# Patient Record
Sex: Female | Born: 1985 | Race: White | Hispanic: No | Marital: Married | Smoking: Never smoker
Health system: Southern US, Community
[De-identification: ages and names within clinical notes are randomized; demographics above are authoritative.]

## PROBLEM LIST (undated history)

## (undated) DIAGNOSIS — R87629 Unspecified abnormal cytological findings in specimens from vagina: Secondary | ICD-10-CM

---

## 2010-11-01 HISTORY — PX: COLPOSCOPY: SHX161

## 2012-04-26 ENCOUNTER — Encounter: Payer: Self-pay | Admitting: Gastroenterology

## 2012-04-26 ENCOUNTER — Ambulatory Visit (INDEPENDENT_AMBULATORY_CARE_PROVIDER_SITE_OTHER): Payer: BC Managed Care – PPO | Admitting: Gastroenterology

## 2012-04-26 VITALS — BP 132/74 | HR 68 | Ht 68.0 in | Wt 170.0 lb

## 2012-04-26 DIAGNOSIS — K625 Hemorrhage of anus and rectum: Secondary | ICD-10-CM

## 2012-04-26 MED ORDER — MOVIPREP 100 G PO SOLR: 1.0000 | ORAL | Status: DC

## 2012-04-26 NOTE — Progress Notes (Signed)
HPI: This is a   very pleasant 26 year old woman who I am meeting for the first time today.   This past weekend blood in stool.  All day.  Was bloody diarrhea at first.  She gets occasional loose stools but never bleeding, usuall dairy related. Worse pains in abd.  [facility] Was given cipro/flagyl.  She had labs (wbc 13K), also given pred tapering pack.  No bowel troubles in family.  Overall stable weight.    No sick contacts.   Review of systems: Pertinent positive and negative review of systems were noted in the above HPI section. Complete review of systems was performed and was otherwise normal.    History reviewed. No pertinent past medical history.  History reviewed. No pertinent past surgical history.  Current Outpatient Prescriptions  Medication Sig Dispense Refill  . ciprofloxacin (CIPRO) 500 MG tablet Take 500 mg by mouth 2 (two) times daily.      . clindamycin (CLINDAGEL) 1 % gel Apply 1 application topically 2 (two) times daily.      . metroNIDAZOLE (FLAGYL) 500 MG tablet Take 500 mg by mouth 3 (three) times daily.      . Norgestimate-Ethinyl Estradiol Triphasic (TRI-SPRINTEC) 0.18/0.215/0.25 MG-35 MCG tablet Take 1 tablet by mouth daily.      . predniSONE (DELTASONE) 10 MG tablet Take 10 mg by mouth as directed.        Allergies as of 04/26/2012  . (No Known Allergies)    History reviewed. No pertinent family history.  History   Social History  . Marital Status: Single    Spouse Name: N/A    Number of Children: N/A  . Years of Education: N/A   Occupational History  . Not on file.   Social History Main Topics  . Smoking status: Never Smoker   . Smokeless tobacco: Not on file  . Alcohol Use: Yes     rare  . Drug Use: No  . Sexually Active: Not on file   Other Topics Concern  . Not on file   Social History Narrative  . No narrative on file       Physical Exam: BP 132/74  Pulse 68  Ht 5\' 8"  (1.727 m)  Wt 170 lb (77.111 kg)   BMI 25.85 kg/m2 Constitutional: generally well-appearing Psychiatric: alert and oriented x3 Eyes: extraocular movements intact Mouth: oral pharynx moist, no lesions Neck: supple no lymphadenopathy Cardiovascular: heart regular rate and rhythm Lungs: clear to auscultation bilaterally Abdomen: soft, nontender, nondistended, no obvious ascites, no peritoneal signs, normal bowel sounds Extremities: no lower extremity edema bilaterally Skin: no lesions on visible extremities    Assessment and plan: 26 y.o. female with  acute, bloody diarrhea that is already improving  Most likely this was an infectious process. She was started on Cipro, Flagyl, also prednisone. She is going to complete her antibiotic and steroid taper. Given the bleeding I think we should proceed with endoscopic examination with colonoscopy in 4-5 weeks to allow some more time for healing. She knows to call back sooner if she has trouble. She has had significant nausea with the Flagyl and I recommended she cut back to 250 mg 3 times a day rather than 500.

## 2012-04-26 NOTE — Patient Instructions (Addendum)
You will be set up for a colonoscopy in 4-5 weeks. Continue the antibiotics until gone.  #1 side effect of flagyl is nausea. Continue the steroids until gone. Ok to decrease dose of flagyl to 250mg  three times  A day due to nausea ((cut the 500mg  pill in half).

## 2012-06-13 ENCOUNTER — Other Ambulatory Visit: Payer: BC Managed Care – PPO | Admitting: Gastroenterology

## 2016-11-01 NOTE — L&D Delivery Note (Signed)
Patient was C/C/+1and pushed for approx 1hr 40 minutes with epidural.   NSVD female infant, Apgars 8/9, weight pending.   The patient had a superficial midline vaginal laceration not extending to perineum, repaired with 2-0 vicryl. Fundus was firm. EBL was expected amount. Placenta was delivered intact. Vagina was clear.  Baby was vigorous and doing skin to skin with mother.  Vicki Gill

## 2017-02-09 LAB — OB RESULTS CONSOLE GC/CHLAMYDIA
Chlamydia: NEGATIVE
GC PROBE AMP, GENITAL: NEGATIVE

## 2017-02-09 LAB — OB RESULTS CONSOLE HIV ANTIBODY (ROUTINE TESTING): HIV: NONREACTIVE

## 2017-02-09 LAB — OB RESULTS CONSOLE ANTIBODY SCREEN: Antibody Screen: NEGATIVE

## 2017-02-09 LAB — OB RESULTS CONSOLE RUBELLA ANTIBODY, IGM: RUBELLA: IMMUNE

## 2017-02-09 LAB — OB RESULTS CONSOLE ABO/RH: RH Type: NEGATIVE

## 2017-02-09 LAB — OB RESULTS CONSOLE RPR: RPR: NONREACTIVE

## 2017-02-09 LAB — OB RESULTS CONSOLE HEPATITIS B SURFACE ANTIGEN: Hepatitis B Surface Ag: NEGATIVE

## 2017-06-23 ENCOUNTER — Other Ambulatory Visit (HOSPITAL_COMMUNITY): Payer: Self-pay | Admitting: Obstetrics

## 2017-06-23 DIAGNOSIS — Z3689 Encounter for other specified antenatal screening: Secondary | ICD-10-CM

## 2017-07-05 ENCOUNTER — Encounter (HOSPITAL_COMMUNITY): Payer: Self-pay | Admitting: *Deleted

## 2017-07-05 ENCOUNTER — Encounter: Payer: Self-pay | Admitting: Gastroenterology

## 2017-07-05 ENCOUNTER — Encounter (HOSPITAL_COMMUNITY): Payer: Self-pay

## 2017-07-06 ENCOUNTER — Ambulatory Visit (HOSPITAL_COMMUNITY)
Admission: RE | Admit: 2017-07-06 | Discharge: 2017-07-06 | Disposition: A | Discharge status: Home / Self Care | Payer: BLUE CROSS/BLUE SHIELD | Source: Ambulatory Visit | Attending: Obstetrics | Admitting: Obstetrics

## 2017-07-06 ENCOUNTER — Encounter (HOSPITAL_COMMUNITY): Payer: Self-pay

## 2017-07-06 ENCOUNTER — Other Ambulatory Visit (HOSPITAL_COMMUNITY): Payer: Self-pay | Admitting: *Deleted

## 2017-07-06 DIAGNOSIS — Z3689 Encounter for other specified antenatal screening: Secondary | ICD-10-CM

## 2017-07-06 DIAGNOSIS — Z3A33 33 weeks gestation of pregnancy: Secondary | ICD-10-CM | POA: Insufficient documentation

## 2017-07-06 DIAGNOSIS — O283 Abnormal ultrasonic finding on antenatal screening of mother: Secondary | ICD-10-CM | POA: Diagnosis present

## 2017-07-06 DIAGNOSIS — O359XX0 Maternal care for (suspected) fetal abnormality and damage, unspecified, not applicable or unspecified: Secondary | ICD-10-CM

## 2017-07-06 HISTORY — DX: Unspecified abnormal cytological findings in specimens from vagina: R87.629

## 2017-07-26 LAB — OB RESULTS CONSOLE GBS: GBS: NEGATIVE

## 2017-07-27 ENCOUNTER — Ambulatory Visit (HOSPITAL_COMMUNITY)
Admission: RE | Admit: 2017-07-27 | Discharge: 2017-07-27 | Disposition: A | Discharge status: Home / Self Care | Payer: BLUE CROSS/BLUE SHIELD | Source: Ambulatory Visit | Attending: Obstetrics | Admitting: Obstetrics

## 2017-07-27 ENCOUNTER — Other Ambulatory Visit (HOSPITAL_COMMUNITY): Payer: Self-pay | Admitting: Obstetrics and Gynecology

## 2017-07-27 ENCOUNTER — Encounter (HOSPITAL_COMMUNITY): Payer: Self-pay

## 2017-07-27 DIAGNOSIS — Z3A36 36 weeks gestation of pregnancy: Secondary | ICD-10-CM | POA: Diagnosis not present

## 2017-07-27 DIAGNOSIS — Z362 Encounter for other antenatal screening follow-up: Secondary | ICD-10-CM | POA: Insufficient documentation

## 2017-07-27 DIAGNOSIS — O359XX0 Maternal care for (suspected) fetal abnormality and damage, unspecified, not applicable or unspecified: Secondary | ICD-10-CM

## 2017-07-27 DIAGNOSIS — O283 Abnormal ultrasonic finding on antenatal screening of mother: Secondary | ICD-10-CM | POA: Insufficient documentation

## 2017-08-12 ENCOUNTER — Inpatient Hospital Stay (HOSPITAL_COMMUNITY): Payer: BLUE CROSS/BLUE SHIELD | Admitting: Anesthesiology

## 2017-08-12 ENCOUNTER — Inpatient Hospital Stay (HOSPITAL_COMMUNITY)
Admission: AD | Admit: 2017-08-12 | Discharge: 2017-08-14 | DRG: 807 | Disposition: A | Discharge status: Home / Self Care | Payer: BLUE CROSS/BLUE SHIELD | Source: Ambulatory Visit | Attending: Obstetrics and Gynecology | Admitting: Obstetrics and Gynecology

## 2017-08-12 ENCOUNTER — Encounter (HOSPITAL_COMMUNITY): Payer: Self-pay | Admitting: *Deleted

## 2017-08-12 DIAGNOSIS — Z6791 Unspecified blood type, Rh negative: Secondary | ICD-10-CM

## 2017-08-12 DIAGNOSIS — Z3490 Encounter for supervision of normal pregnancy, unspecified, unspecified trimester: Secondary | ICD-10-CM

## 2017-08-12 DIAGNOSIS — Z3A38 38 weeks gestation of pregnancy: Secondary | ICD-10-CM

## 2017-08-12 DIAGNOSIS — O4292 Full-term premature rupture of membranes, unspecified as to length of time between rupture and onset of labor: Principal | ICD-10-CM | POA: Diagnosis present

## 2017-08-12 DIAGNOSIS — O26893 Other specified pregnancy related conditions, third trimester: Secondary | ICD-10-CM | POA: Diagnosis present

## 2017-08-12 LAB — COMPREHENSIVE METABOLIC PANEL
ALBUMIN: 3.5 g/dL (ref 3.5–5.0)
ALT: 20 U/L (ref 14–54)
ANION GAP: 7 (ref 5–15)
AST: 21 U/L (ref 15–41)
Alkaline Phosphatase: 181 U/L — ABNORMAL HIGH (ref 38–126)
BILIRUBIN TOTAL: 1.2 mg/dL (ref 0.3–1.2)
BUN: 10 mg/dL (ref 6–20)
CO2: 21 mmol/L — ABNORMAL LOW (ref 22–32)
Calcium: 9.5 mg/dL (ref 8.9–10.3)
Chloride: 107 mmol/L (ref 101–111)
Creatinine, Ser: 0.69 mg/dL (ref 0.44–1.00)
GFR calc non Af Amer: >60 mL/min (ref >60)
GLUCOSE: 94 mg/dL (ref 65–99)
POTASSIUM: 3.8 mmol/L (ref 3.5–5.1)
Sodium: 135 mmol/L (ref 135–145)
TOTAL PROTEIN: 6.5 g/dL (ref 6.5–8.1)

## 2017-08-12 LAB — CBC
HCT: 36.1 % (ref 36.0–46.0)
HEMOGLOBIN: 12.6 g/dL (ref 12.0–15.0)
MCH: 30.6 pg (ref 26.0–34.0)
MCHC: 34.9 g/dL (ref 30.0–36.0)
MCV: 87.6 fL (ref 78.0–100.0)
Platelets: 180 10*3/uL (ref 150–400)
RBC: 4.12 MIL/uL (ref 3.87–5.11)
RDW: 12.5 % (ref 11.5–15.5)
WBC: 14.3 10*3/uL — ABNORMAL HIGH (ref 4.0–10.5)

## 2017-08-12 LAB — TYPE AND SCREEN
ABO/RH(D): O NEG
Antibody Screen: NEGATIVE

## 2017-08-12 LAB — ABO/RH: ABO/RH(D): O NEG

## 2017-08-12 MED ORDER — BENZOCAINE-MENTHOL 20-0.5 % EX AERO: 1.0000 "application " | INHALATION_SPRAY | CUTANEOUS | Status: DC | PRN

## 2017-08-12 MED ORDER — SIMETHICONE 80 MG PO CHEW: 80.0000 mg | CHEWABLE_TABLET | ORAL | Status: DC | PRN

## 2017-08-12 MED ORDER — PHENYLEPHRINE 40 MCG/ML (10ML) SYRINGE FOR IV PUSH (FOR BLOOD PRESSURE SUPPORT)
80.0000 ug | PREFILLED_SYRINGE | INTRAVENOUS | Status: DC | PRN
  Filled 2017-08-12: qty 5

## 2017-08-12 MED ORDER — LIDOCAINE HCL (PF) 1 % IJ SOLN
INTRAMUSCULAR | Status: DC | PRN
  Administered 2017-08-12 (×2): 4 mL

## 2017-08-12 MED ORDER — PRENATAL MULTIVITAMIN CH
1.0000 | ORAL_TABLET | Freq: Every day | ORAL | Status: DC
  Administered 2017-08-13 – 2017-08-14 (×2): 1 via ORAL
  Filled 2017-08-12 (×2): qty 1

## 2017-08-12 MED ORDER — LACTATED RINGERS IV SOLN: INTRAVENOUS | Status: DC

## 2017-08-12 MED ORDER — EPHEDRINE 5 MG/ML INJ
10.0000 mg | INTRAVENOUS | Status: DC | PRN
  Filled 2017-08-12: qty 2

## 2017-08-12 MED ORDER — ACETAMINOPHEN 325 MG PO TABS
650.0000 mg | ORAL_TABLET | ORAL | Status: DC | PRN
Start: 2017-08-12 — End: 2017-08-14

## 2017-08-12 MED ORDER — TETANUS-DIPHTH-ACELL PERTUSSIS 5-2.5-18.5 LF-MCG/0.5 IM SUSP: 0.5000 mL | Freq: Once | INTRAMUSCULAR | Status: DC

## 2017-08-12 MED ORDER — BUTORPHANOL TARTRATE 1 MG/ML IJ SOLN: 1.0000 mg | INTRAMUSCULAR | Status: DC | PRN

## 2017-08-12 MED ORDER — ONDANSETRON HCL 4 MG/2ML IJ SOLN: 4.0000 mg | Freq: Four times a day (QID) | INTRAMUSCULAR | Status: DC | PRN

## 2017-08-12 MED ORDER — OXYTOCIN 40 UNITS IN LACTATED RINGERS INFUSION - SIMPLE MED
2.5000 [IU]/h | INTRAVENOUS | Status: DC
  Filled 2017-08-12: qty 1000

## 2017-08-12 MED ORDER — DIPHENHYDRAMINE HCL 25 MG PO CAPS: 25.0000 mg | ORAL_CAPSULE | Freq: Four times a day (QID) | ORAL | Status: DC | PRN

## 2017-08-12 MED ORDER — SENNOSIDES-DOCUSATE SODIUM 8.6-50 MG PO TABS
2.0000 | ORAL_TABLET | ORAL | Status: DC
  Administered 2017-08-13: 2 via ORAL

## 2017-08-12 MED ORDER — OXYTOCIN BOLUS FROM INFUSION: 500.0000 mL | Freq: Once | INTRAVENOUS | Status: DC

## 2017-08-12 MED ORDER — OXYCODONE-ACETAMINOPHEN 5-325 MG PO TABS: 2.0000 | ORAL_TABLET | ORAL | Status: DC | PRN

## 2017-08-12 MED ORDER — LIDOCAINE HCL (PF) 1 % IJ SOLN
30.0000 mL | INTRAMUSCULAR | Status: DC | PRN
  Filled 2017-08-12: qty 30

## 2017-08-12 MED ORDER — ONDANSETRON HCL 4 MG PO TABS
4.0000 mg | ORAL_TABLET | ORAL | Status: DC | PRN
Start: 2017-08-12 — End: 2017-08-14

## 2017-08-12 MED ORDER — PHENYLEPHRINE 40 MCG/ML (10ML) SYRINGE FOR IV PUSH (FOR BLOOD PRESSURE SUPPORT)
80.0000 ug | PREFILLED_SYRINGE | INTRAVENOUS | Status: DC | PRN
  Filled 2017-08-12: qty 10
  Filled 2017-08-12: qty 5

## 2017-08-12 MED ORDER — FLEET ENEMA 7-19 GM/118ML RE ENEM: 1.0000 | ENEMA | Freq: Every day | RECTAL | Status: DC | PRN

## 2017-08-12 MED ORDER — COCONUT OIL OIL
1.0000 "application " | TOPICAL_OIL | Status: DC | PRN
  Filled 2017-08-12: qty 120

## 2017-08-12 MED ORDER — OXYCODONE-ACETAMINOPHEN 5-325 MG PO TABS: 1.0000 | ORAL_TABLET | ORAL | Status: DC | PRN

## 2017-08-12 MED ORDER — WITCH HAZEL-GLYCERIN EX PADS: 1.0000 "application " | MEDICATED_PAD | CUTANEOUS | Status: DC | PRN

## 2017-08-12 MED ORDER — IBUPROFEN 600 MG PO TABS
600.0000 mg | ORAL_TABLET | Freq: Four times a day (QID) | ORAL | Status: DC
  Administered 2017-08-13 – 2017-08-14 (×6): 600 mg via ORAL
  Filled 2017-08-12 (×6): qty 1

## 2017-08-12 MED ORDER — DIPHENHYDRAMINE HCL 50 MG/ML IJ SOLN: 12.5000 mg | INTRAMUSCULAR | Status: DC | PRN

## 2017-08-12 MED ORDER — ONDANSETRON HCL 4 MG/2ML IJ SOLN: 4.0000 mg | INTRAMUSCULAR | Status: DC | PRN

## 2017-08-12 MED ORDER — ZOLPIDEM TARTRATE 5 MG PO TABS
5.0000 mg | ORAL_TABLET | Freq: Every evening | ORAL | Status: DC | PRN
Start: 2017-08-12 — End: 2017-08-14

## 2017-08-12 MED ORDER — DIBUCAINE 1 % RE OINT: 1.0000 "application " | TOPICAL_OINTMENT | RECTAL | Status: DC | PRN

## 2017-08-12 MED ORDER — LACTATED RINGERS IV SOLN: 500.0000 mL | INTRAVENOUS | Status: DC | PRN

## 2017-08-12 MED ORDER — FENTANYL 2.5 MCG/ML BUPIVACAINE 1/10 % EPIDURAL INFUSION (WH - ANES)
14.0000 mL/h | INTRAMUSCULAR | Status: DC | PRN
  Administered 2017-08-12: 14 mL/h via EPIDURAL
  Filled 2017-08-12: qty 100

## 2017-08-12 MED ORDER — LACTATED RINGERS IV SOLN: 500.0000 mL | Freq: Once | INTRAVENOUS | Status: DC

## 2017-08-12 MED ORDER — ACETAMINOPHEN 325 MG PO TABS: 650.0000 mg | ORAL_TABLET | ORAL | Status: DC | PRN

## 2017-08-12 MED ORDER — TERBUTALINE SULFATE 1 MG/ML IJ SOLN
0.2500 mg | Freq: Once | INTRAMUSCULAR | Status: DC | PRN
  Filled 2017-08-12: qty 1

## 2017-08-12 MED ORDER — OXYTOCIN 40 UNITS IN LACTATED RINGERS INFUSION - SIMPLE MED
1.0000 m[IU]/min | INTRAVENOUS | Status: DC
  Administered 2017-08-12: 2 m[IU]/min via INTRAVENOUS

## 2017-08-12 MED ORDER — FENTANYL 2.5 MCG/ML BUPIVACAINE 1/10 % EPIDURAL INFUSION (WH - ANES): 14.0000 mL/h | INTRAMUSCULAR | Status: DC | PRN

## 2017-08-12 MED ORDER — SOD CITRATE-CITRIC ACID 500-334 MG/5ML PO SOLN: 30.0000 mL | ORAL | Status: DC | PRN

## 2017-08-12 NOTE — Anesthesia Procedure Notes (Signed)
Epidural Patient location during procedure: OB  Staffing Anesthesiologist: Brileigh Sevcik Performed: anesthesiologist   Preanesthetic Checklist Completed: patient identified, pre-op evaluation, timeout performed, IV checked, risks and benefits discussed and monitors and equipment checked  Epidural Patient position: sitting Prep: site prepped and draped and DuraPrep Patient monitoring: heart rate, continuous pulse ox and blood pressure Approach: midline Location: L3-L4 Injection technique: LOR air and LOR saline  Needle:  Needle type: Tuohy  Needle gauge: 17 G Needle length: 9 cm Needle insertion depth: 5 cm Catheter type: closed end flexible Catheter size: 19 Gauge Catheter at skin depth: 10 cm Test dose: negative  Assessment Sensory level: T8 Events: blood not aspirated, injection not painful, no injection resistance, negative IV test and no paresthesia  Additional Notes Reason for block:procedure for pain     

## 2017-08-12 NOTE — H&P (Signed)
31 y.o. [redacted]w[redacted]d  G1P0000 comes in from office for direct admission due to PROM.  Pt reports water broke approx 3am.  Otherwise has good fetal movement and no bleeding.  Past Medical History:  Diagnosis Date  . Vaginal Pap smear, abnormal     Past Surgical History:  Procedure Laterality Date  . COLPOSCOPY  2012    OB History  Gravida Para Term Preterm AB Living  1 0 0 0 0    SAB TAB Ectopic Multiple Live Births  0 0 0        # Outcome Date GA Lbr Len/2nd Weight Sex Delivery Anes PTL Lv  1 Current               Social History   Social History  . Marital status: Married    Spouse name: N/A  . Number of children: N/A  . Years of education: N/A   Occupational History  . Not on file.   Social History Main Topics  . Smoking status: Never Smoker  . Smokeless tobacco: Never Used  . Alcohol use Yes     Comment: none with pregnancy  . Drug use: No  . Sexual activity: Not on file   Other Topics Concern  . Not on file   Social History Narrative   ** Merged History Encounter **       Patient has no known allergies.    Prenatal Transfer Tool  Maternal Diabetes: No Genetic Screening: Normal Maternal Ultrasounds/Referrals: Abnormal:  Findings:   Fetal renal pyelectasis, Other: right, with duplicated collecting system.  Left collecting system is normal,  peds uro after delivery Fetal Ultrasounds or other Referrals:  Referred to Materal Fetal Medicine for above Maternal Substance Abuse:  No Significant Maternal Medications:  None Significant Maternal Lab Results: Lab values include: Group B Strep negative, Rh negative  Other PNC: uncomplicated.    Vitals:   08/12/17 1307 08/12/17 1350  BP: (!) 159/102 (!) 158/97  Pulse: 67 68  Resp:    Temp:       Lungs/Cor:  NAD Abdomen:  soft, gravid Ex:  no cords, erythema SVE:  4/80/-1 FHTs:  145, good STV, NST R Toco:  q2-5   A/P   Admit with PROM 3am  GBS Neg  Pitocin 2x2 started   Last [place] EFW 6#7 on  07/27/2017  Epidural when desired  Other routine care  [place], [facility]

## 2017-08-12 NOTE — Anesthesia Preprocedure Evaluation (Signed)
Anesthesia Evaluation  Patient identified by MRN, date of birth, ID band Patient awake    Reviewed: Allergy & Precautions, NPO status , Patient's Chart, lab work & pertinent test results  Airway Mallampati: II  TM Distance: >3 FB Neck ROM: Full    Dental no notable dental hx.    Pulmonary neg pulmonary ROS,    Pulmonary exam normal breath sounds clear to auscultation       Cardiovascular negative cardio ROS Normal cardiovascular exam Rhythm:Regular Rate:Normal     Neuro/Psych negative neurological ROS  negative psych ROS   GI/Hepatic negative GI ROS, Neg liver ROS,   Endo/Other  negative endocrine ROS  Renal/GU negative Renal ROS  negative genitourinary   Musculoskeletal negative musculoskeletal ROS (+)   Abdominal   Peds negative pediatric ROS (+)  Hematology negative hematology ROS (+)   Anesthesia Other Findings   Reproductive/Obstetrics negative OB ROS                             Anesthesia Physical Anesthesia Plan  ASA: II  Anesthesia Plan: Epidural   Post-op Pain Management:    Induction:   PONV Risk Score and Plan:   Airway Management Planned:   Additional Equipment:   Intra-op Plan:   Post-operative Plan:   Informed Consent: I have reviewed the patients History and Physical, chart, labs and discussed the procedure including the risks, benefits and alternatives for the proposed anesthesia with the patient or authorized representative who has indicated his/her understanding and acceptance.     Plan Discussed with:   Anesthesia Plan Comments:         Anesthesia Quick Evaluation

## 2017-08-12 NOTE — Anesthesia Pain Management Evaluation Note (Signed)
  CRNA Pain Management Visit Note  Patient: Vicki Gill, 31 y.o., female  "Hello I am a member of the anesthesia team at [facility]. We have an anesthesia team available at all times to provide care throughout the hospital, including epidural management and anesthesia for C-section. I don't know your plan for the delivery whether it a natural birth, water birth, IV sedation, nitrous supplementation, doula or epidural, but we want to meet your pain goals."   1.Was your pain managed to your expectations on prior hospitalizations?   No   2.What is your expectation for pain management during this hospitalization?     Epidural VS Natural  3.How can we help you reach that goal? Would like to try natural patient is open to epidural  Record the patient's initial score and the patient's pain goal.   Pain: 1  Pain Goal: 8 The [facility] wants you to be able to say your pain was always managed very well.  Rica Records 08/12/2017

## 2017-08-13 ENCOUNTER — Encounter (HOSPITAL_COMMUNITY): Payer: Self-pay | Admitting: Family

## 2017-08-13 LAB — CBC
HEMATOCRIT: 32.4 % — AB (ref 36.0–46.0)
Hemoglobin: 11.5 g/dL — ABNORMAL LOW (ref 12.0–15.0)
MCH: 31 pg (ref 26.0–34.0)
MCHC: 35.5 g/dL (ref 30.0–36.0)
MCV: 87.3 fL (ref 78.0–100.0)
PLATELETS: 173 10*3/uL (ref 150–400)
RBC: 3.71 MIL/uL — ABNORMAL LOW (ref 3.87–5.11)
RDW: 12.6 % (ref 11.5–15.5)
WBC: 17.1 10*3/uL — AB (ref 4.0–10.5)

## 2017-08-13 LAB — RPR: RPR Ser Ql: NONREACTIVE

## 2017-08-13 NOTE — Anesthesia Postprocedure Evaluation (Signed)
Anesthesia Post Note  Patient: Vicki Gill  Procedure(s) Performed: AN AD HOC LABOR EPIDURAL     Patient location during evaluation: Mother Baby Anesthesia Type: Epidural Level of consciousness: awake and alert and oriented Pain management: satisfactory to patient Vital Signs Assessment: post-procedure vital signs reviewed and stable Respiratory status: spontaneous breathing and nonlabored ventilation Cardiovascular status: stable Postop Assessment: no headache, no backache, no signs of nausea or vomiting, adequate PO intake and patient able to bend at knees (patient up walking) Anesthetic complications: no    Last Vitals:  Vitals:   08/12/17 2323 08/13/17 0322  BP:  137/82  Pulse:  97  Resp:  19  Temp: 36.6 C   SpO2:      Last Pain:  Vitals:   08/13/17 0536  TempSrc:   PainSc: 3    Pain Goal:                 Madison Hickman

## 2017-08-13 NOTE — Lactation Note (Addendum)
This note was copied from a baby's chart. Lactation Consultation Note  Patient Name: Vicki Gill Today's Date: 08/13/2017 Reason for consult: Initial assessment;Difficult latch (LC assisted to re-latch the baby on the left breast without the NS in cross cradle )  Baby is 17 hours old  As LC entered the room baby latched with #16 NS on the left breast with depth, released shortly after (  5-7 mins , no milk in the NS noted) . With moms permission checked the compressibility of the that areola , noted to be compressible enough to try to latch without the NS . Baby latched with depth and fed 15 -20 mins more with swallows, increased with breast compressions.  When baby released on his  own the nipple was well rounded.  Family walked in. And LC unable to size for the#20 NS.  Mother informed of post-discharge support and given phone number to the lactation department, including services for phone call assistance; out-patient appointments; and breastfeeding support group. List of other breastfeeding resources in the community given in the handout. Encouraged mother to call for problems or concerns related to breastfeeding.  Report given to the [facility] and reviewed the [facility] plan below.   LC Plan and recommendations :  Breast shells between feedings except when sleeping( LC instructed mom )  Prior to latch - breast massage , hand express, pre-pump with hand pump  If areola compressible attempt to latch - check for FISH Lips -   if unable to apply NS #20 .  Listen for swallows , and intermittent breast compressions.  After the baby finishes - check for milk in the NS.  If the NS had to be used for latching - have the [facility] set up the DEBP for post pumping.  Mom aware the hand pump for now is for pre- pumping, DEBP for post pumping.     Maternal Data Has patient been taught Hand Expression?: Yes Does the patient have breastfeeding experience prior to this delivery?:  No  Feeding Feeding Type: Breast Fed Length of feed: 20 min (swallows noted )  LATCH Score Latch: Grasps breast easily, tongue down, lips flanged, rhythmical sucking.  Audible Swallowing: A few with stimulation (increased swallows noted , areola compressible )  Type of Nipple: Everted at rest and after stimulation  Comfort (Breast/Nipple): Soft / non-tender  Hold (Positioning): Assistance needed to correctly position infant at breast and maintain latch.  LATCH Score: 8  Interventions Interventions: Breast feeding basics reviewed;Assisted with latch;Skin to skin;Hand express;Breast compression;Adjust position;Support pillows;Position options;Shells;Hand pump (see LC note )  Lactation Tools Discussed/Used Tools: Shells;Pump Nipple shield size: 16;20;Other (comment) (baby latched without the NS , ) Shell Type: Inverted Breast pump type: Manual Pump Review: Setup, frequency, and cleaning Initiated by:: MAI  Date initiated:: 08/13/17   Consult Status Consult Status: Follow-up Date: 08/14/17 Follow-up type: In-patient    Vicki Gill Vicki Gill 08/13/2017, 2:24 PM

## 2017-08-13 NOTE — Progress Notes (Addendum)
Patient is eating, ambulating, voiding.  Pain control is good. Appropriate lochia..  No HA, vision change or RUQ pain.  No CP/SOB.  No other complaints.  Vitals:   08/12/17 2323 08/13/17 0322 08/13/17 0915 08/13/17 1015  BP:  137/82 (!) 139/93 (!) 138/91  Pulse: 79 97 85   Resp: Temp: 97.9 F (36.6 C)  97.9 F (36.6 C)   TempSrc: Oral  Oral   SpO2:   100%   Weight:      Height:        Fundus firm Perineum without swelling. Ext: no calf tenderness  Lab Results  Component Value Date   WBC 17.1 (H) 08/13/2017   HGB 11.5 (L) 08/13/2017   HCT 32.4 (L) 08/13/2017   MCV 87.3 08/13/2017   PLT 173 08/13/2017    --/--/O NEG, O NEG (10/12 1155)  A/P Post partum day 1. Circ desired, reviewed risks/benefits/potential complications, consent obtained.  Will do later today or tomorrow am prior to discharge. Mild range BPs, will continue to monitor WBC 17, afebrile, will recheck CBC in am.  Routine care.  Expect d/c 10/14.    Philip Aspen

## 2017-08-14 LAB — CBC WITH DIFFERENTIAL/PLATELET
BASOS ABS: 0 10*3/uL (ref 0.0–0.1)
Basophils Relative: 0 %
EOS PCT: 1 %
Eosinophils Absolute: 0.2 10*3/uL (ref 0.0–0.7)
HEMATOCRIT: 33.2 % — AB (ref 36.0–46.0)
Hemoglobin: 11.4 g/dL — ABNORMAL LOW (ref 12.0–15.0)
LYMPHS PCT: 19 %
Lymphs Abs: 2.3 10*3/uL (ref 0.7–4.0)
MCH: 31 pg (ref 26.0–34.0)
MCHC: 34.3 g/dL (ref 30.0–36.0)
MCV: 90.2 fL (ref 78.0–100.0)
Monocytes Absolute: 0.4 10*3/uL (ref 0.1–1.0)
Monocytes Relative: 4 %
NEUTROS ABS: 9.3 10*3/uL — AB (ref 1.7–7.7)
Neutrophils Relative %: 76 %
PLATELETS: 164 10*3/uL (ref 150–400)
RBC: 3.68 MIL/uL — AB (ref 3.87–5.11)
RDW: 12.8 % (ref 11.5–15.5)
WBC: 12.3 10*3/uL — AB (ref 4.0–10.5)

## 2017-08-14 NOTE — Discharge Summary (Signed)
Obstetric Discharge Summary Reason for Admission: rupture of membranes Prenatal Procedures: ultrasound Intrapartum Procedures: spontaneous vaginal delivery Postpartum Procedures: none Complications-Operative and Postpartum: vaginal laceration Hemoglobin  Date Value Ref Range Status  08/14/2017 11.4 (L) 12.0 - 15.0 g/dL Final   HCT  Date Value Ref Range Status  08/14/2017 33.2 (L) 36.0 - 46.0 % Final    Physical Exam:  General: alert and cooperative Lochia: appropriate Uterine Fundus: firm DVT Evaluation: No evidence of DVT seen on physical exam.  Discharge Diagnoses: Term Pregnancy-delivered  Discharge Information: Date: 08/14/2017 Activity: pelvic rest Diet: routine Medications: PNV and Ibuprofen Condition: stable Instructions: refer to practice specific booklet Discharge to: home Follow-up Information    Philip Aspen, DO Follow up in 1 week(s).   Specialty:  Obstetrics and Gynecology Why:  for blood pressure check Contact information: [address] [place] [address] [phone]           Newborn Data: Live born female  Birth Weight: 7 lb 6.7 oz (3365 g) APGAR: 8, 9  Newborn Delivery   Birth date/time:  08/12/2017 20:36:00 Delivery type:  Vaginal, Spontaneous Delivery      Home with mother.  Philip Aspen 08/14/2017, 8:46 AM

## 2017-08-14 NOTE — Lactation Note (Signed)
This note was copied from a baby's chart. Lactation Consultation Note  Patient Name: Boy Yanice Brightbill Today's Date: 08/14/2017 Reason for consult: Follow-up assessment;Other (Comment) (resized mom for the NS in case she needs it )  Baby is 18 hours old , post circ,  As LC entered the room baby had just fed on the right breast / cradle position with #16 NS / no milk in the NS  Baby still awake and hungry. LC assisted mom to latch on the left breast / cross cradle without the NS , multiple swallows  Noted and baby fed for 10 mins, - unlatched and then acting hungry , LC assisted to re-latch on the left breast / football and Jackelin fed for 8 mins with swallows and depth. Nipple well rounded when Arieal released.  LC encouraged mom to feed STS until the baby can stay awake for a feeding ,  Shells between feedings except when sleeping.  Prior to latch - breast massage , hand express, pre-pump to make the nipple more erect ,  And attempt to latch , if unable to use the #20 NS .  After the baby feeds 1st breast , offer the 2nd breast if the baby only feeds one side , post pump both breast for 10-15 mins , save milk for next feeding.  Mom denies soreness , sore nipple and engorgement prevention and tx.  LC recommended to be consistent with I/O's , shells , pre- pumping ., and post pumping .  Mother informed of post-discharge support and given phone number to the lactation department, including services for phone call assistance; out-patient appointments; and breastfeeding support group. List of other breastfeeding resources in the community given in the handout. Encouraged mother to call for problems or concerns related to breastfeeding.   Maternal Data Has patient been taught Hand Expression?: Yes  Feeding Feeding Type: Breast Fed (left breast / football ) Length of feed: 8 min (multilple swallows , increased with breast compressions )  LATCH Score Latch: Grasps breast easily, tongue down, lips  flanged, rhythmical sucking.  Audible Swallowing: Spontaneous and intermittent  Type of Nipple: Everted at rest and after stimulation  Comfort (Breast/Nipple): Filling, red/small blisters or bruises, mild/mod discomfort  Hold (Positioning): Assistance needed to correctly position infant at breast and maintain latch.  LATCH Score: 8  Interventions Interventions: Breast feeding basics reviewed  Lactation Tools Discussed/Used Tools: Nipple Dorris Carnes;Shells;Pump Nipple shield size: 20;Other (comment) (better fit ) Shell Type: Inverted Breast pump type: Manual WIC Program: No   Consult Status Consult Status: Follow-up Date:  (mom receptive to return for [facility] consult - request in Epic ) Follow-up type: Out-patient    Matilde Sprang Kennethia Lynes 08/14/2017, 2:22 PM

## 2017-08-16 ENCOUNTER — Telehealth: Payer: Self-pay | Admitting: General Practice

## 2017-08-16 NOTE — Telephone Encounter (Signed)
Patient called and left message stating she gave birth on Friday and is breastfeeding. Patient states her right breast is really hard and she has high blood pressure & a bad headache. Called patient and she states she actually talked to her doctor yesterday and is feeling better. Patient asked about appt for Thursday & I confirmed time with her. Patient had no questions

## 2017-09-14 IMAGING — US US MFM OB DETAIL+14 WK
1 series · 13 of 28 positions shown · non-contrast
Comparison: none

[Series 1: us mfm ob detail+14 wk · 110 acquisitions, 13 frames shown]
[im 5/110]
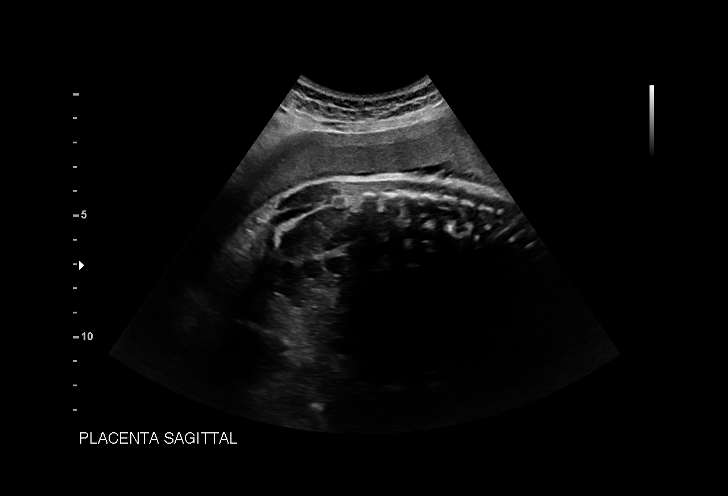
[im 13/110]
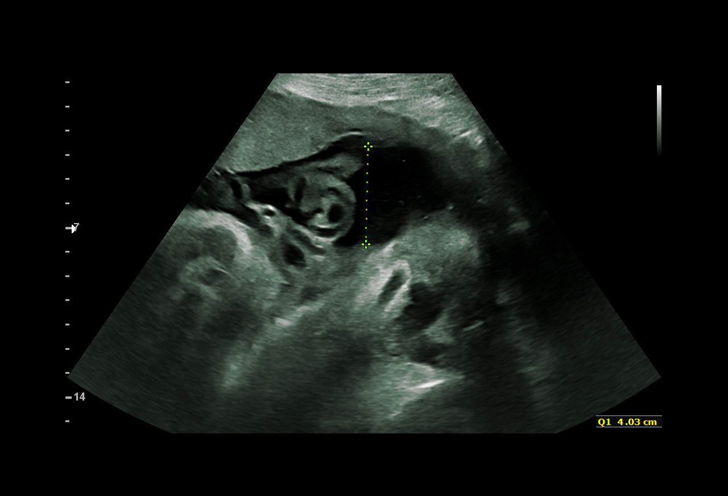
[im 21/110]
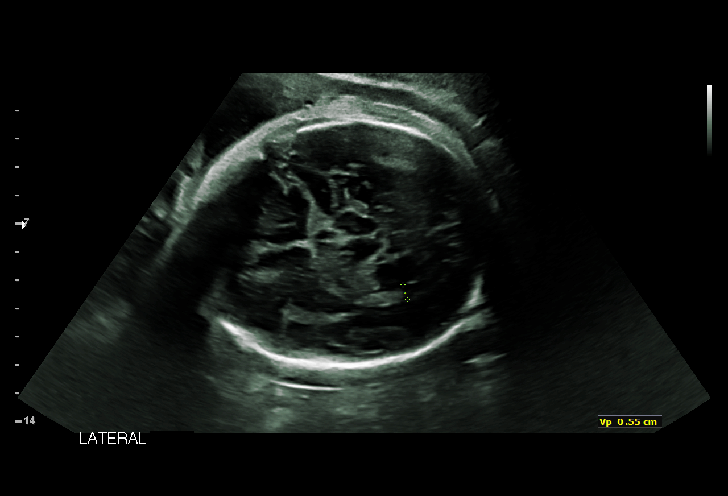
[im 29/110]
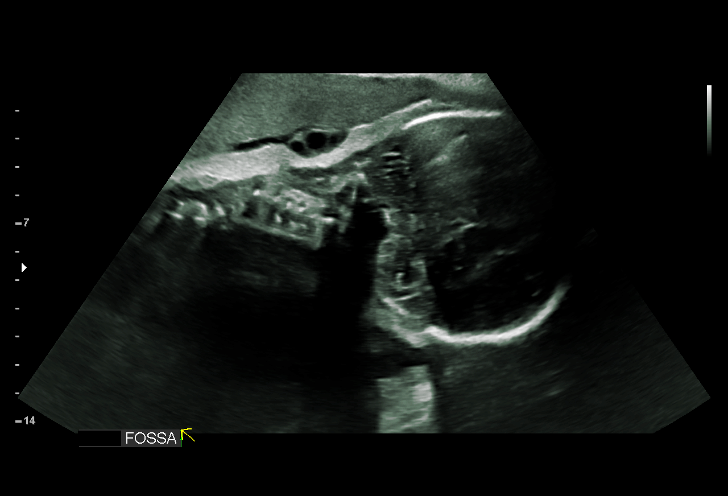
[im 37/110]
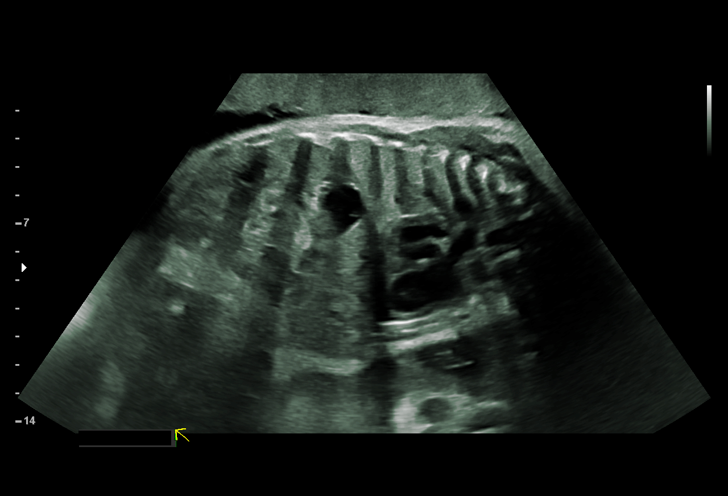
[im 45/110]
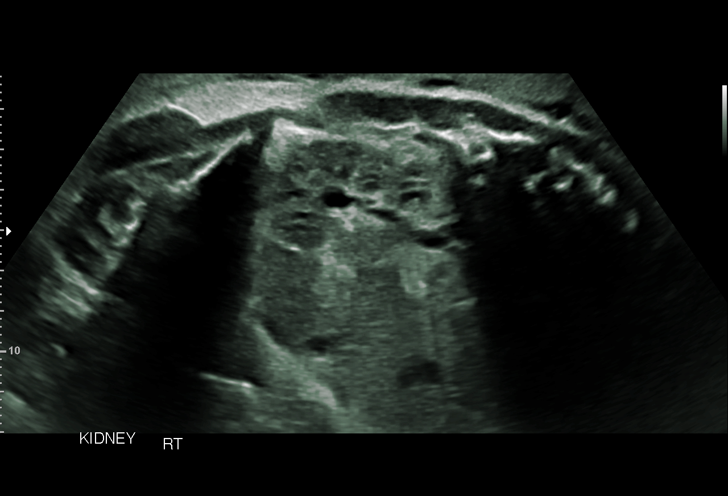
[im 57/110]
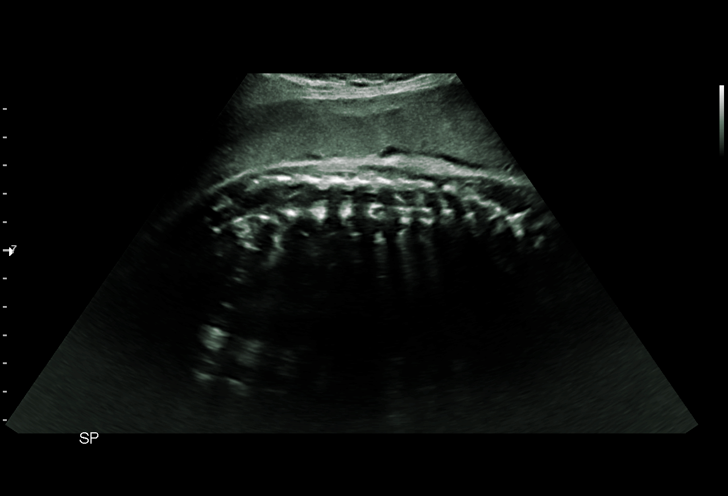
[im 65/110]
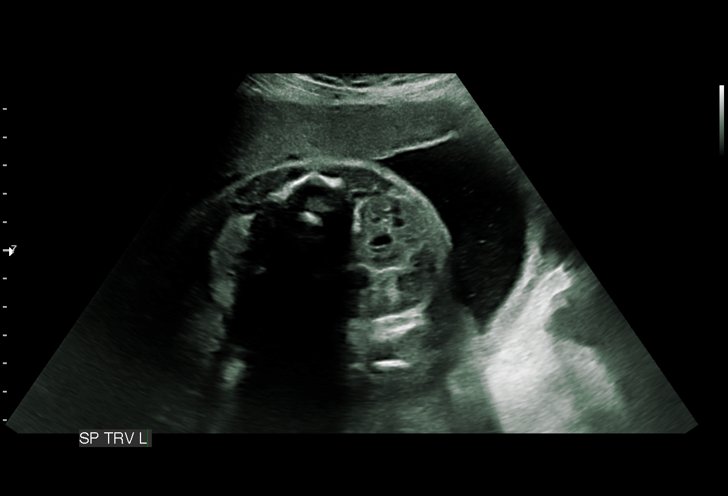
[im 73/110]
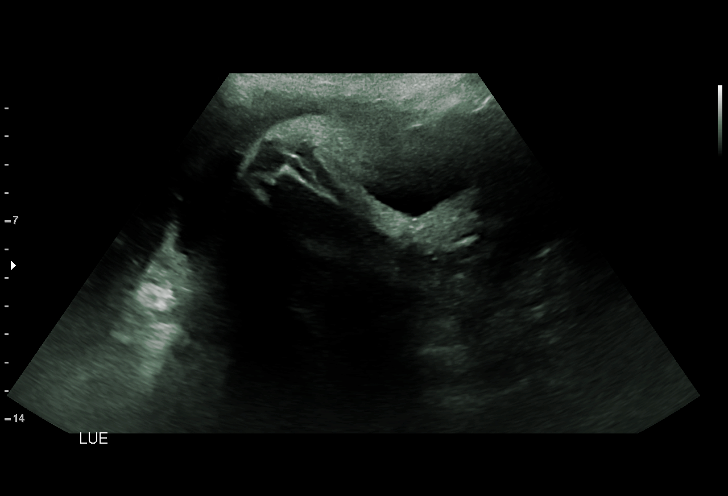
[im 81/110]
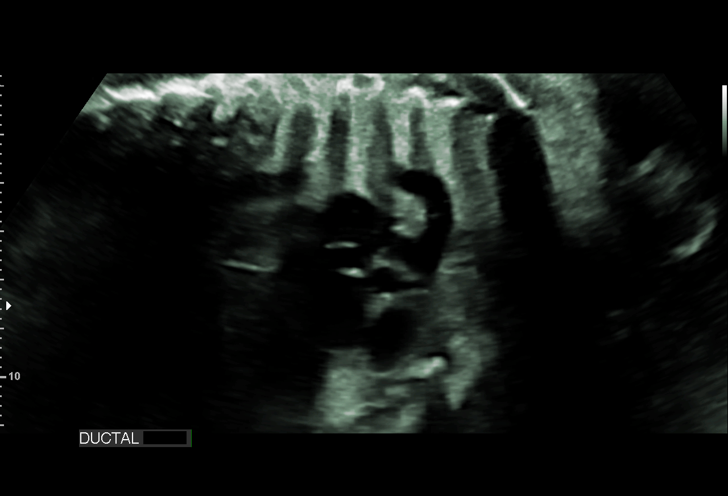
[im 89/110]
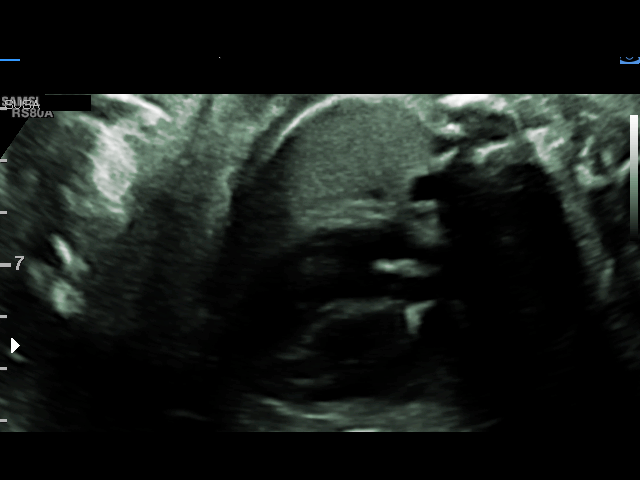
[im 97/110]
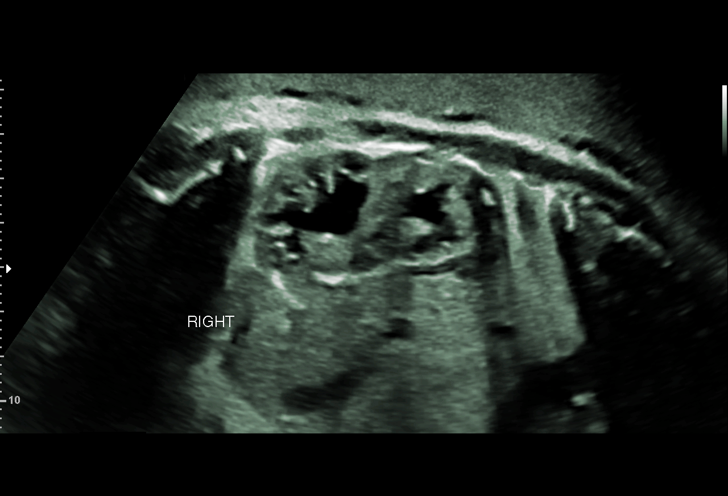
[im 105/110]
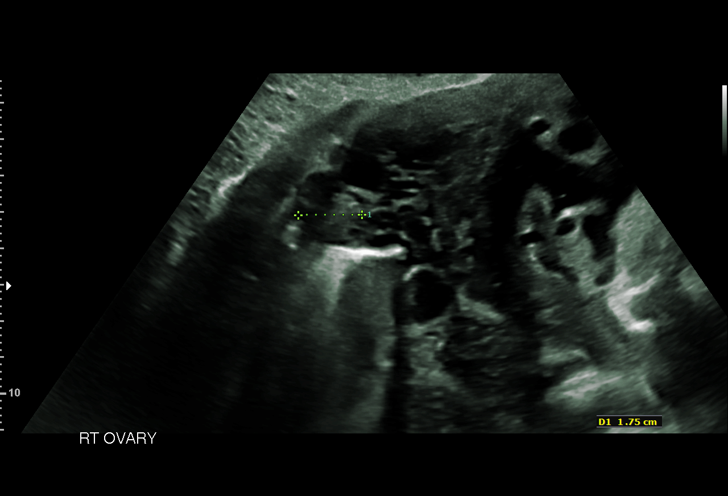

[13 of 28 positions shown; findings below may reference images not displayed]

OBGYN

1  MACHE SANTOYA             [id number]       [id number]     [id number]
Indications

33 weeks gestation of pregnancy
Pyelectasis of fetus on prenatal ultrasound
Encounter for antenatal screening for
malformations
OB History

Gravidity:    1
Fetal Evaluation

Num Of Fetuses:     1
Fetal Heart         135
Rate(bpm):
Cardiac Activity:   Observed
Presentation:       Cephalic
Placenta:           Anterior, above cervical os
P. Cord Insertion:  Visualized, central

Amniotic Fluid
AFI FV:      Subjectively within normal limits

AFI Sum(cm)     %Tile       Largest Pocket(cm)
19.88           74

RUQ(cm)       RLQ(cm)       LUQ(cm)        LLQ(cm)
4.03
Biometry

BPD:      87.2  mm     G. Age:  35w 1d         87  %    CI:        77.39   %   70 - 86
FL/HC:      21.3   %   19.4 -
HC:      313.8  mm     G. Age:  35w 1d         55  %    HC/AC:      0.99       0.96 -
AC:      316.4  mm     G. Age:  35w 4d         94  %    FL/BPD:     76.7   %   71 - 87
FL:       66.9  mm     G. Age:  34w 3d         62  %    FL/AC:      21.1   %   20 - 24
HUM:      57.7  mm     G. Age:  33w 3d         56  %
CER:      44.6  mm     G. Age:  38w 4d       > 95  %
LV:        5.5  mm

Est. FW:    2093  gm    5 lb 12 oz      83  %
Gestational Age

Clinical EDD:  33w 4d                                        EDD:   08/20/17
U/S Today:     35w 1d                                        EDD:   08/09/17
Best:          33w 4d    Det. By:   Clinical EDD             EDD:   08/20/17
Anatomy

Cranium:               Appears normal         Aortic Arch:            Appears normal
Cavum:                 Appears normal         Ductal Arch:            Appears normal
Ventricles:            Appears normal         Diaphragm:              Appears normal
Choroid Plexus:        Appears normal         Stomach:                Appears normal, left
sided
Cerebellum:            Appears normal         Abdomen:                Appears normal
Posterior Fossa:       Appears normal         Abdominal Wall:         Appears nml (cord
insert, abd wall)
Nuchal Fold:           Not applicable (>20    Cord Vessels:           Appears normal (3
wks GA)                                        vessel cord)
Face:                  Not well visualized    Kidneys:                Double collecting
system, pyelect
Lips:                  Not well visualized    Bladder:                Appears normal
Thoracic:              Appears normal         Spine:                  Appears normal
Heart:                 Appears normal         Upper Extremities:      Visualized
(4CH, axis, and
situs)
RVOT:                  Appears normal         Lower Extremities:      Visualized
LVOT:                  Appears normal

Other:  Technically difficult due to advanced gestational age. Technically
difficult due to fetal position. Parents do not wish to know sex of fetus.
Ltd views of extremities.
Cervix Uterus Adnexa

Cervix
Not visualized (advanced GA >00wks)

Left Ovary
Within normal limits.

Right Ovary
Within normal limits.
Impression

Singleton intrauterine pregnancy at 33+4 with pyelectasis
Review of the anatomy shows right dual collecting system
with attendant pyelectasis in both collecting systems.
contralateral kidney is normal.  Otherwise, no sonographic
markers for aneuploidy or structural anomalies are seen
However, facial evaluation should be considered suboptimal
secondary to fetal position
Amniotic fluid volume is normal
Estimated fetal weight is 2614g which is growth in the 83rd
percentile
Recommendations

Repeat scan in 3 weeks. The patient will need periatric
urology evaluation postdelivery. If you have a favored
urologist for referral please feel free to refer the patient for
predelivery consultation. Otherwise we can do so at her next
visit, if she desires. I let her know she will need to inform the
delivery pediatrican

## 2017-10-05 IMAGING — US US MFM OB FOLLOW-UP
1 series · 13 of 28 positions shown · non-contrast
Comparison: none

[Series 1: us mfm ob follow-up · 13 of 50 slices shown]
[im 2/50]
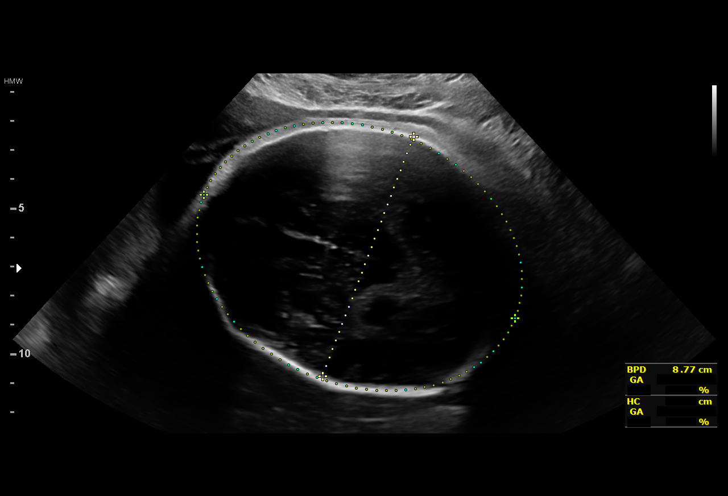
[im 6/50]
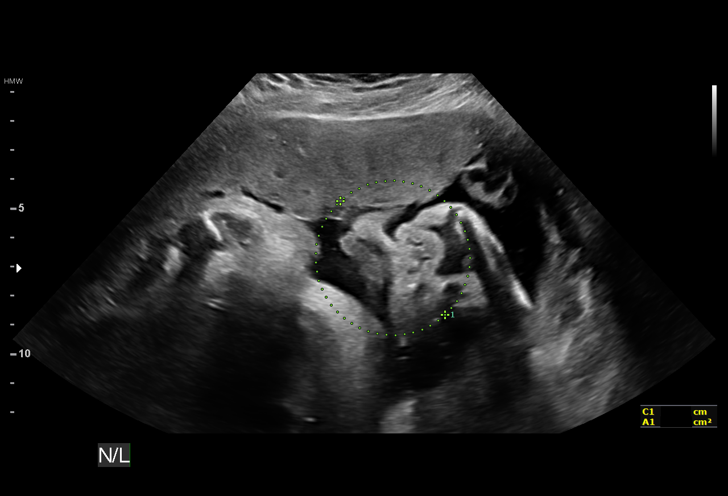
[im 10/50]
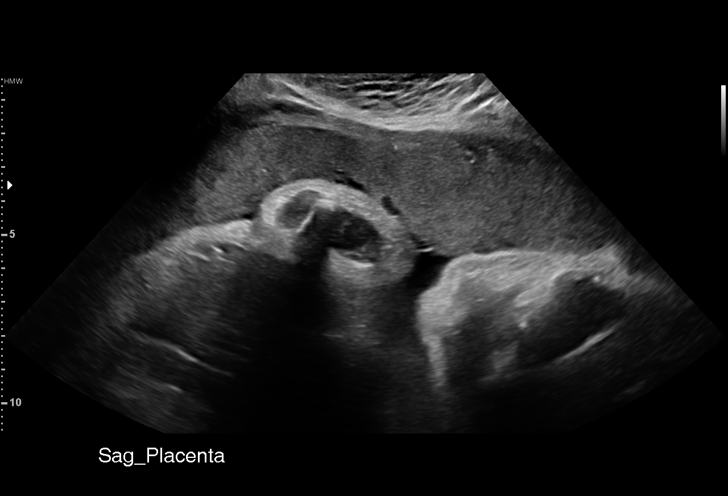
[im 13/50]
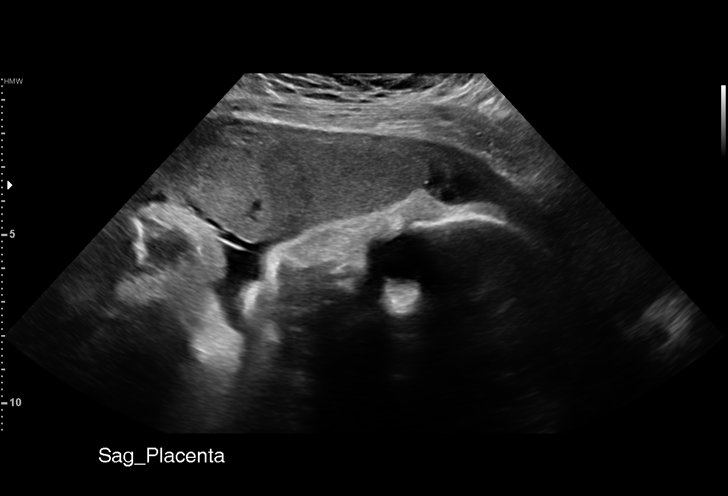
[im 17/50]
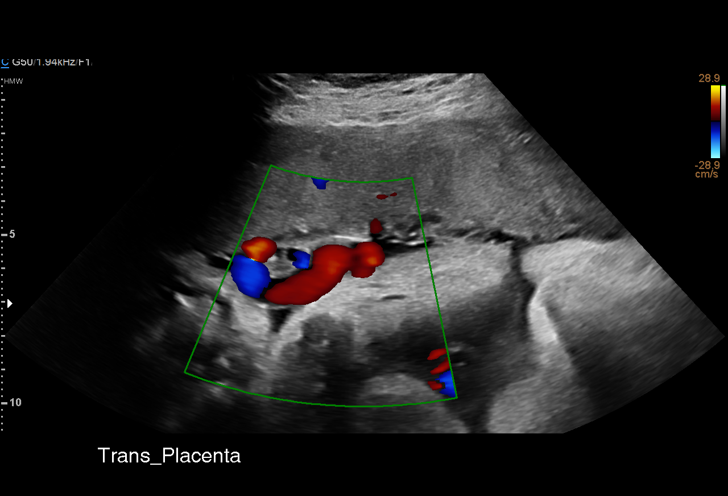
[im 20/50]
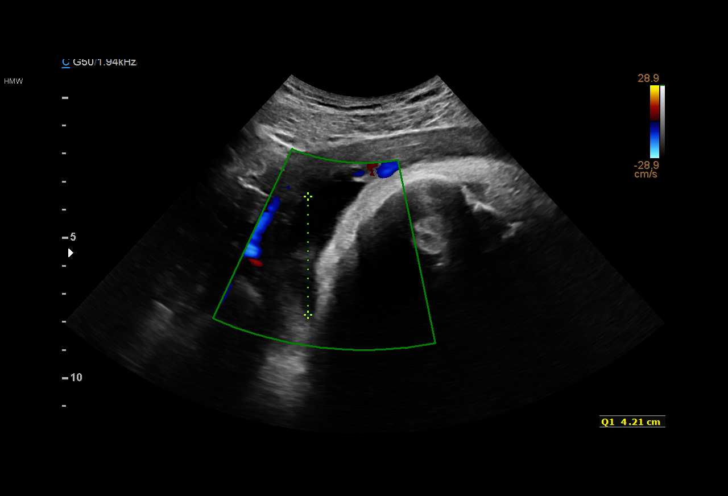
[im 26/50]
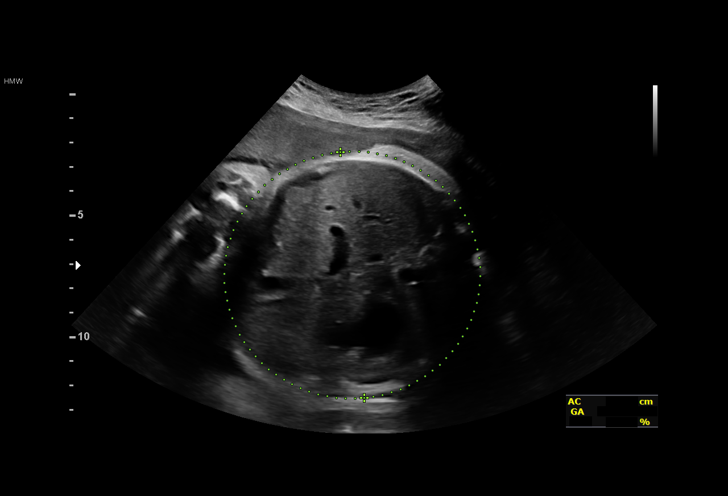
[im 30/50]
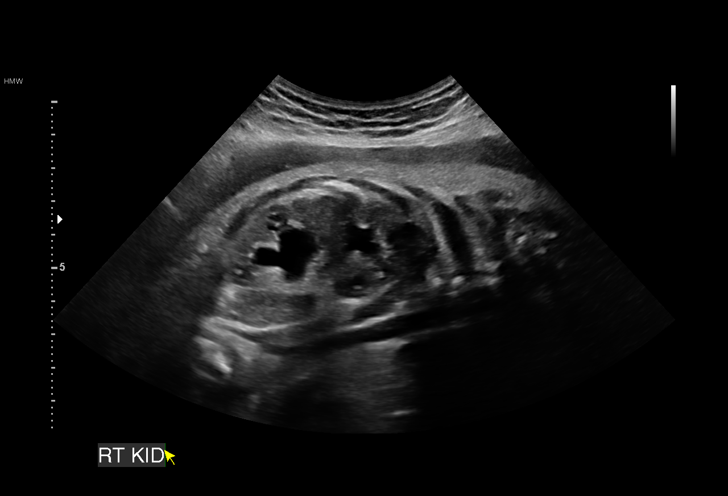
[im 33/50]
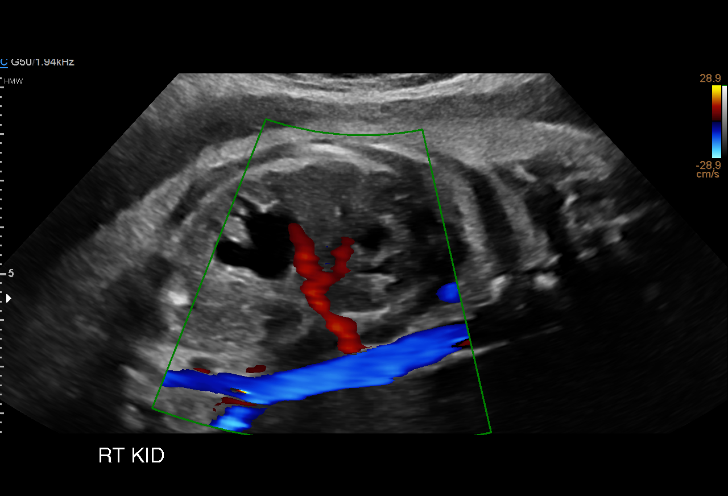
[im 37/50]
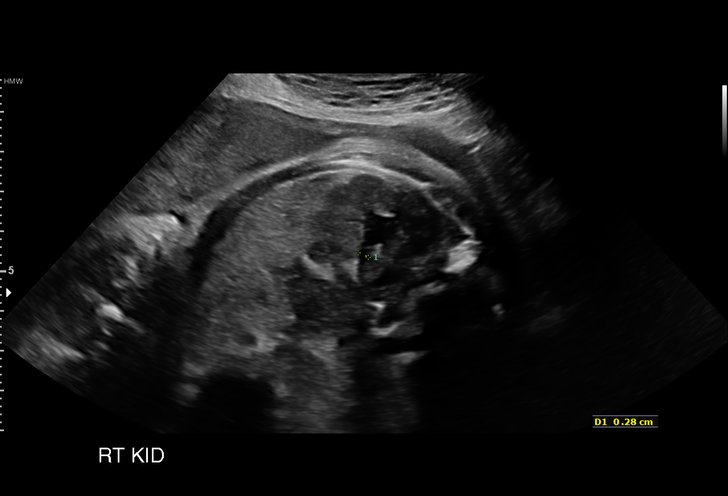
[im 40/50]
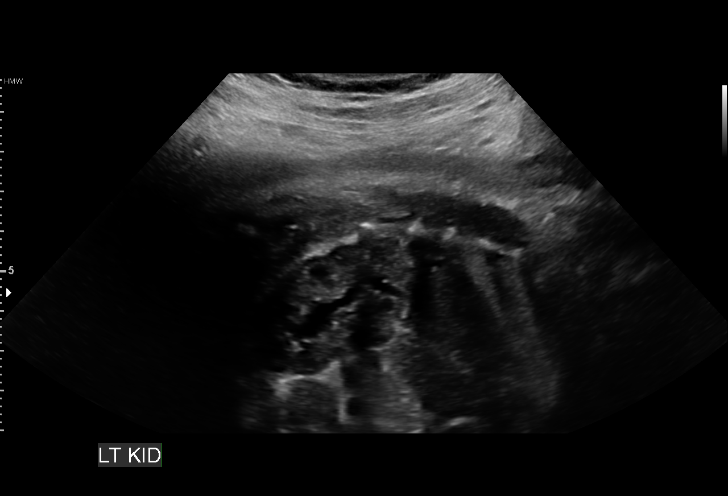
[im 44/50]
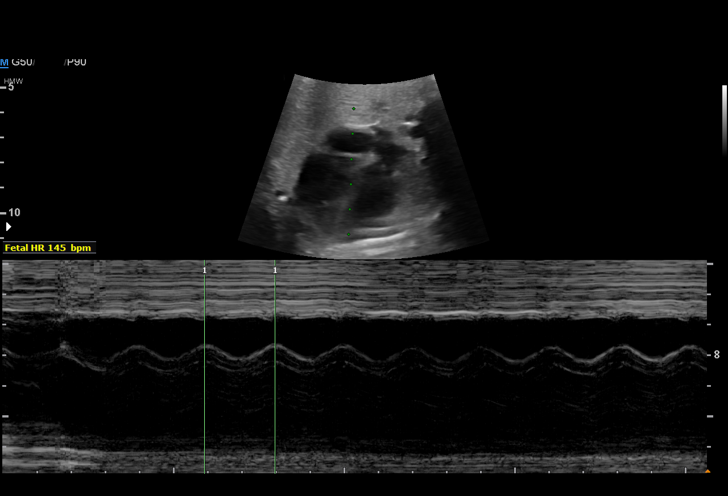
[im 48/50]
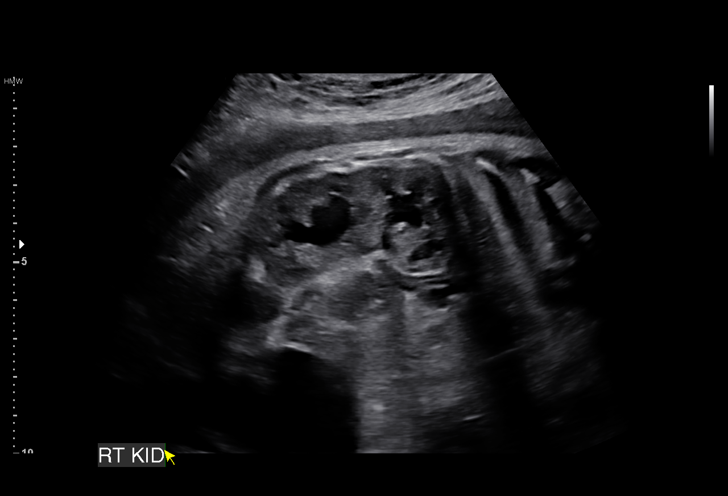

[13 of 28 positions shown; findings below may reference images not displayed]

OBGYN

1  GINILSON FROZZA              [id number]      [id number]     [id number]
Indications

36 weeks gestation of pregnancy
Pyelectasis of fetus on prenatal ultrasound
Encounter for other antenatal screening
follow-up
OB History

Gravidity:    1
Fetal Evaluation

Num Of Fetuses:     1
Fetal Heart         145
Rate(bpm):
Cardiac Activity:   Observed
Presentation:       Cephalic
Placenta:           Anterior, above cervical os
P. Cord Insertion:  Visualized, central

Amniotic Fluid
AFI FV:      Subjectively within normal limits

AFI Sum(cm)     %Tile       Largest Pocket(cm)
10.82           29

RUQ(cm)       RLQ(cm)       LUQ(cm)        LLQ(cm)
4.21          4.97          1.64           0
Biometry

BPD:      88.3  mm     G. Age:  35w 5d         37  %    CI:        76.14   %   70 - 86
FL/HC:      21.7   %   20.8 -
HC:      320.7  mm     G. Age:  36w 1d         16  %    HC/AC:      0.97       0.92 -
AC:      329.5  mm     G. Age:  36w 6d         70  %    FL/BPD:     78.7   %   71 - 87
FL:       69.5  mm     G. Age:  35w 5d         25  %    FL/AC:      21.1   %   20 - 24
HUM:      60.5  mm     G. Age:  35w 1d         39  %

Est. FW:    0416  gm      6 lb 7 oz     62  %
Gestational Age

Clinical EDD:  36w 4d                                        EDD:   08/20/17
U/S Today:     36w 1d                                        EDD:   08/23/17
Best:          36w 4d    Det. By:   Clinical EDD             EDD:   08/20/17
Anatomy

Cranium:               Appears normal         Aortic Arch:            Previously seen
Cavum:                 Previously seen        Ductal Arch:            Previously seen
Ventricles:            Appears normal         Diaphragm:              Previously seen
Choroid Plexus:        Previously seen        Stomach:                Appears normal, left
sided
Cerebellum:            Previously seen        Abdomen:                Previously seen
Posterior Fossa:       Previously seen        Abdominal Wall:         Previously seen
Nuchal Fold:           Not applicable (>20    Cord Vessels:           Previously seen
wks GA)
Face:                  Orbits nl; profile not Kidneys:                Double collecting
well visualized
system, pyelect
Lips:                  Appears normal         Bladder:                Appears normal
Thoracic:              Appears normal         Spine:                  Previously seen
Heart:                 Appears normal         Upper Extremities:      Previously seen
(4CH, axis, and
situs)
RVOT:                  Previously seen        Lower Extremities:      Previously seen
LVOT:                  Previously seen

Other:  Technically difficult due to advanced gestational age. Technically
difficult due to fetal position. Parents do not wish to know sex of fetus.
Ltd views of extremities.
Cervix Uterus Adnexa

Cervix
Normal appearance by transabdominal scan.

Uterus
No abnormality visualized.

Left Ovary
No adnexal mass visualized.

Right Ovary
No adnexal mass visualized.

Cul De Sac:   No free fluid seen.

Adnexa:       No abnormality visualized.
Impression

Singleton intrauterine pregnancy at 33+4 with pyelectasis
Interval review of the anatomy continues to show right dual
collecting system with mild pyelectasis in both collecting
systems.
Lower pole has 8mm of UTD, upper pole has 2mm.
Contralateral kidney is still normal.  Otherwise, no
sonographic markers for aneuploidy or structural anomalies
are seen
However, facial evaluation is still suboptimal secondary to
fetal position
Amniotic fluid volume is normal
Estimated fetal weight is 2916g which is growth in the 62nd
percentile
Recommendations

No further scans are needed in MFM. The patient will need
periatric urology evaluation postdelivery.

## 2019-05-01 LAB — OB RESULTS CONSOLE ANTIBODY SCREEN: Antibody Screen: NEGATIVE

## 2019-05-01 LAB — OB RESULTS CONSOLE GC/CHLAMYDIA
Chlamydia: NEGATIVE
Gonorrhea: NEGATIVE

## 2019-05-01 LAB — OB RESULTS CONSOLE HEPATITIS B SURFACE ANTIGEN: Hepatitis B Surface Ag: NEGATIVE

## 2019-05-01 LAB — OB RESULTS CONSOLE HIV ANTIBODY (ROUTINE TESTING): HIV: NONREACTIVE

## 2019-05-01 LAB — OB RESULTS CONSOLE RPR: RPR: NONREACTIVE

## 2019-05-01 LAB — OB RESULTS CONSOLE ABO/RH: RH Type: NEGATIVE

## 2019-05-01 LAB — OB RESULTS CONSOLE RUBELLA ANTIBODY, IGM: Rubella: IMMUNE

## 2019-10-11 LAB — OB RESULTS CONSOLE GBS: GBS: NEGATIVE

## 2019-10-16 ENCOUNTER — Other Ambulatory Visit: Payer: Self-pay | Admitting: Obstetrics

## 2019-10-17 ENCOUNTER — Telehealth (HOSPITAL_COMMUNITY): Payer: Self-pay | Admitting: *Deleted

## 2019-10-17 ENCOUNTER — Encounter (HOSPITAL_COMMUNITY): Payer: Self-pay | Admitting: *Deleted

## 2019-10-17 NOTE — Telephone Encounter (Signed)
Preadmission screen  

## 2019-10-20 ENCOUNTER — Other Ambulatory Visit (HOSPITAL_COMMUNITY)
Admission: RE | Admit: 2019-10-20 | Discharge: 2019-10-20 | Disposition: A | Discharge status: Home / Self Care | Payer: 59 | Source: Ambulatory Visit | Attending: Obstetrics | Admitting: Obstetrics

## 2019-10-20 DIAGNOSIS — Z01812 Encounter for preprocedural laboratory examination: Secondary | ICD-10-CM | POA: Insufficient documentation

## 2019-10-20 DIAGNOSIS — Z20828 Contact with and (suspected) exposure to other viral communicable diseases: Secondary | ICD-10-CM | POA: Insufficient documentation

## 2019-10-20 LAB — SARS CORONAVIRUS 2 (TAT 6-24 HRS): SARS Coronavirus 2: NEGATIVE

## 2019-10-22 ENCOUNTER — Encounter (HOSPITAL_COMMUNITY): Payer: Self-pay | Admitting: Obstetrics

## 2019-10-22 ENCOUNTER — Encounter (HOSPITAL_COMMUNITY): Payer: Self-pay

## 2019-10-22 ENCOUNTER — Other Ambulatory Visit: Payer: Self-pay

## 2019-10-22 ENCOUNTER — Inpatient Hospital Stay (HOSPITAL_COMMUNITY): Admission: RE | Admit: 2019-10-22 | Payer: 59 | Source: Ambulatory Visit | Admitting: Obstetrics

## 2019-10-22 ENCOUNTER — Observation Stay (HOSPITAL_COMMUNITY)
Admission: AD | Admit: 2019-10-22 | Discharge: 2019-10-22 | Disposition: A | Discharge status: Home / Self Care | Payer: 59 | Attending: Obstetrics | Admitting: Obstetrics

## 2019-10-22 DIAGNOSIS — Z3A37 37 weeks gestation of pregnancy: Secondary | ICD-10-CM | POA: Diagnosis not present

## 2019-10-22 DIAGNOSIS — O26893 Other specified pregnancy related conditions, third trimester: Secondary | ICD-10-CM | POA: Diagnosis not present

## 2019-10-22 DIAGNOSIS — O321XX0 Maternal care for breech presentation, not applicable or unspecified: Principal | ICD-10-CM | POA: Insufficient documentation

## 2019-10-22 DIAGNOSIS — Z6741 Type O blood, Rh negative: Secondary | ICD-10-CM | POA: Diagnosis not present

## 2019-10-22 LAB — TYPE AND SCREEN
ABO/RH(D): O NEG
Antibody Screen: POSITIVE

## 2019-10-22 MED ORDER — RHO D IMMUNE GLOBULIN 1500 UNIT/2ML IJ SOSY
300.0000 ug | PREFILLED_SYRINGE | Freq: Once | INTRAMUSCULAR | Status: AC
  Administered 2019-10-22: 300 ug via INTRAVENOUS
  Filled 2019-10-22: qty 2

## 2019-10-22 MED ORDER — LACTATED RINGERS IV SOLN: INTRAVENOUS | Status: DC

## 2019-10-22 MED ORDER — TERBUTALINE SULFATE 1 MG/ML IJ SOLN
0.2500 mg | INTRAMUSCULAR | Status: AC
  Administered 2019-10-22: 0.25 mg via SUBCUTANEOUS

## 2019-10-22 MED ORDER — TERBUTALINE SULFATE 1 MG/ML IJ SOLN
INTRAMUSCULAR | Status: AC
  Filled 2019-10-22: qty 1

## 2019-10-22 NOTE — Progress Notes (Signed)
OBGYN Note NST for 60 minutes after ECV. FHR 150s, +accels, no decels, toco irritable. Reactive NST.  Patient reports no complaints [place] confirms cephalic Okay for discharge after rhogam Vicki Gill 10/22/19 9:55 AM

## 2019-10-22 NOTE — Discharge Summary (Signed)
Obstetric Discharge Summary  Patient admitted for ECV.  ECV successful.  She was monitored after the procedure and noted to have a category 1 FHR tracing.  She is Rh negative and received a dose of rhogam prior to discharge  Hemoglobin  Date Value Ref Range Status  08/14/2017 11.4 (L) 12.0 - 15.0 g/dL Final   HCT  Date Value Ref Range Status  08/14/2017 33.2 (L) 36.0 - 46.0 % Final    Physical Exam:  General: alert, cooperative, and appears stated age  Discharge Diagnoses:  Successful ECV  Discharge Information: Date: 10/22/2019 Activity: unrestricted Diet: routine Medications: PNV Condition: stable Instructions: refer to practice specific booklet Discharge to: home      [place] 10/22/2019, 8:19 AM

## 2019-10-22 NOTE — Procedures (Addendum)
Breech presentation confirmed on bedside ultrasound.  Terbutaline administered.  With two attempts, head moved to the pelvis.  Cephalic position confirmed.  Fetus placed on FHR monitor, HR 145.   Dr. Shellia Cleverly assisting.  Will monitor for 30 to 60 minutes.  Rh negative--rhogam prior to discharge

## 2019-10-22 NOTE — OR Nursing (Signed)
Version successful Dr Carlis Abbott in room baby back on monitor fhr 145

## 2019-10-22 NOTE — H&P (Signed)
33 y.o. G2P1001 @ [redacted]w[redacted]d presents for attempt at ECV.    Past Medical History:  Diagnosis Date  . Vaginal Pap smear, abnormal     Past Surgical History:  Procedure Laterality Date  . COLPOSCOPY  2012    OB History  Gravida Para Term Preterm AB Living  2 1 1  0 0 1  SAB TAB Ectopic Multiple Live Births  0 0 0 0 1    # Outcome Date GA Lbr Len/2nd Weight Sex Delivery Anes PTL Lv  2 Current           1 Term 08/12/17 [redacted]w[redacted]d / 01:49 3365 g M Vag-Spont EPI  LIV    Social History   Socioeconomic History  . Marital status: Married    Spouse name: Not on file  . Number of children: Not on file  . Years of education: Not on file  . Highest education level: Not on file  Occupational History  . Not on file  Tobacco Use  . Smoking status: Never Smoker  . Smokeless tobacco: Never Used  Substance and Sexual Activity  . Alcohol use: Yes    Comment: none with pregnancy  . Drug use: No  . Sexual activity: Not on file  Other Topics Concern  . Not on file  Social History Narrative   ** Merged History Encounter **         Patient has no known allergies.    Prenatal Transfer Tool  Maternal Diabetes: No Genetic Screening: Normal Maternal Ultrasounds/Referrals: Normal Fetal Ultrasounds or other Referrals:  None Maternal Substance Abuse:  No Significant Maternal Medications:  Meds include: Other:  pepcid Significant Maternal Lab Results: Rh negative   ABO, Rh: O/Negative/-- (06/30 0000) Antibody: Negative (06/30 0000) Rubella: Immune (06/30 0000) RPR: Nonreactive (06/30 0000)  HBsAg: Negative (06/30 0000)  HIV: Non-reactive (06/30 0000)  GBS: Negative/-- (12/10 0000)     Other PNC: uncomplicated.    Vitals:   10/22/19 0742  Pulse: 82  Temp: 98 F (36.7 C)  SpO2: 99%     General:  NAD Abdomen:  soft, gravid FHTs:  Category 1 Toco:  qquiet   A/P   33 y.o. G2P1001 [redacted]w[redacted]d presents with breech presentation Desires attempted ECV.  Discussed r/b/a. Terbutaline  Rh  neg--rhogam prior to discharge  [place]

## 2019-10-23 LAB — RH IG WORKUP (INCLUDES ABO/RH)
ABO/RH(D): O NEG
Gestational Age(Wks): 37.4
Unit division: 0

## 2019-11-02 NOTE — L&D Delivery Note (Signed)
Patient was C/C/+2 and pushed for 10 minutes with epidural.    NSVD  female infant, Apgars 8,9, weight P.   The patient had no lacerations. Fundus was firm. EBL was expected amount. Placenta was delivered intact. Vagina was clear.  Delayed cord clamping done for 30-60 seconds while warming baby. Baby was vigorous and doing skin to skin with mother.  Vicki Gill

## 2019-11-09 ENCOUNTER — Encounter (HOSPITAL_COMMUNITY): Payer: Self-pay | Admitting: *Deleted

## 2019-11-09 ENCOUNTER — Telehealth (HOSPITAL_COMMUNITY): Payer: Self-pay | Admitting: *Deleted

## 2019-11-09 NOTE — Telephone Encounter (Signed)
Preadmission screen  

## 2019-11-12 ENCOUNTER — Other Ambulatory Visit (HOSPITAL_COMMUNITY)
Admission: RE | Admit: 2019-11-12 | Discharge: 2019-11-12 | Disposition: A | Discharge status: Home / Self Care | Payer: 59 | Source: Ambulatory Visit | Attending: Obstetrics and Gynecology | Admitting: Obstetrics and Gynecology

## 2019-11-12 LAB — SARS CORONAVIRUS 2 (TAT 6-24 HRS): SARS Coronavirus 2: NEGATIVE

## 2019-11-13 ENCOUNTER — Inpatient Hospital Stay (HOSPITAL_COMMUNITY): Payer: 59

## 2019-11-13 ENCOUNTER — Encounter (HOSPITAL_COMMUNITY): Payer: Self-pay | Admitting: Obstetrics and Gynecology

## 2019-11-13 ENCOUNTER — Inpatient Hospital Stay (HOSPITAL_COMMUNITY)
Admission: AD | Admit: 2019-11-13 | Discharge: 2019-11-15 | DRG: 768 | Disposition: A | Discharge status: Home / Self Care | Payer: 59 | Attending: Obstetrics and Gynecology | Admitting: Obstetrics and Gynecology

## 2019-11-13 ENCOUNTER — Inpatient Hospital Stay (HOSPITAL_COMMUNITY): Payer: 59 | Admitting: Anesthesiology

## 2019-11-13 ENCOUNTER — Other Ambulatory Visit: Payer: Self-pay

## 2019-11-13 DIAGNOSIS — D649 Anemia, unspecified: Secondary | ICD-10-CM | POA: Diagnosis present

## 2019-11-13 DIAGNOSIS — O26893 Other specified pregnancy related conditions, third trimester: Secondary | ICD-10-CM | POA: Diagnosis present

## 2019-11-13 DIAGNOSIS — Z3A40 40 weeks gestation of pregnancy: Secondary | ICD-10-CM | POA: Diagnosis not present

## 2019-11-13 DIAGNOSIS — Z20822 Contact with and (suspected) exposure to covid-19: Secondary | ICD-10-CM | POA: Diagnosis present

## 2019-11-13 DIAGNOSIS — Z6791 Unspecified blood type, Rh negative: Secondary | ICD-10-CM | POA: Diagnosis not present

## 2019-11-13 DIAGNOSIS — O480 Post-term pregnancy: Secondary | ICD-10-CM | POA: Diagnosis present

## 2019-11-13 DIAGNOSIS — O134 Gestational [pregnancy-induced] hypertension without significant proteinuria, complicating childbirth: Secondary | ICD-10-CM | POA: Diagnosis present

## 2019-11-13 DIAGNOSIS — O9902 Anemia complicating childbirth: Secondary | ICD-10-CM | POA: Diagnosis present

## 2019-11-13 LAB — CBC
HCT: 36 % (ref 36.0–46.0)
HCT: 36 % (ref 36.0–46.0)
Hemoglobin: 12.5 g/dL (ref 12.0–15.0)
Hemoglobin: 12.6 g/dL (ref 12.0–15.0)
MCH: 31.6 pg (ref 26.0–34.0)
MCH: 31.4 pg (ref 26.0–34.0)
MCHC: 34.7 g/dL (ref 30.0–36.0)
MCHC: 35 g/dL (ref 30.0–36.0)
MCV: 90.9 fL (ref 80.0–100.0)
MCV: 89.8 fL (ref 80.0–100.0)
Platelets: 155 10*3/uL (ref 150–400)
Platelets: 160 10*3/uL (ref 150–400)
RBC: 3.96 MIL/uL (ref 3.87–5.11)
RBC: 4.01 MIL/uL (ref 3.87–5.11)
RDW: 12.2 % (ref 11.5–15.5)
RDW: 12.3 % (ref 11.5–15.5)
WBC: 10 10*3/uL (ref 4.0–10.5)
WBC: 16.4 10*3/uL — ABNORMAL HIGH (ref 4.0–10.5)
nRBC: 0 % (ref 0.0–0.2)
nRBC: 0 % (ref 0.0–0.2)

## 2019-11-13 LAB — COMPREHENSIVE METABOLIC PANEL
ALT: 21 U/L (ref 0–44)
AST: 21 U/L (ref 15–41)
Albumin: 3 g/dL — ABNORMAL LOW (ref 3.5–5.0)
Alkaline Phosphatase: 111 U/L (ref 38–126)
Anion gap: 11 (ref 5–15)
BUN: 9 mg/dL (ref 6–20)
CO2: 19 mmol/L — ABNORMAL LOW (ref 22–32)
Calcium: 8.6 mg/dL — ABNORMAL LOW (ref 8.9–10.3)
Chloride: 106 mmol/L (ref 98–111)
Creatinine, Ser: 0.67 mg/dL (ref 0.44–1.00)
GFR calc Af Amer: >60 mL/min (ref >60)
GFR calc non Af Amer: >60 mL/min (ref >60)
Glucose, Bld: 103 mg/dL — ABNORMAL HIGH (ref 70–99)
Potassium: 3.6 mmol/L (ref 3.5–5.1)
Sodium: 136 mmol/L (ref 135–145)
Total Bilirubin: 1.1 mg/dL (ref 0.3–1.2)
Total Protein: 5.5 g/dL — ABNORMAL LOW (ref 6.5–8.1)

## 2019-11-13 LAB — RPR: RPR Ser Ql: NONREACTIVE

## 2019-11-13 LAB — PROTEIN / CREATININE RATIO, URINE
Creatinine, Urine: 31.82 mg/dL
Total Protein, Urine: <6 mg/dL

## 2019-11-13 MED ORDER — FENTANYL-BUPIVACAINE-NACL 0.5-0.125-0.9 MG/250ML-% EP SOLN
12.0000 mL/h | EPIDURAL | Status: DC | PRN
  Filled 2019-11-13: qty 250

## 2019-11-13 MED ORDER — OXYTOCIN 40 UNITS IN NORMAL SALINE INFUSION - SIMPLE MED
2.5000 [IU]/h | INTRAVENOUS | Status: DC
  Administered 2019-11-13: 17:00:00 2.5 [IU]/h via INTRAVENOUS

## 2019-11-13 MED ORDER — DIPHENHYDRAMINE HCL 25 MG PO CAPS: 25.0000 mg | ORAL_CAPSULE | Freq: Four times a day (QID) | ORAL | Status: DC | PRN

## 2019-11-13 MED ORDER — MEASLES, MUMPS & RUBELLA VAC IJ SOLR: 0.5000 mL | Freq: Once | INTRAMUSCULAR | Status: DC

## 2019-11-13 MED ORDER — COCONUT OIL OIL
1.0000 "application " | TOPICAL_OIL | Status: DC | PRN
  Administered 2019-11-14: 1 via TOPICAL

## 2019-11-13 MED ORDER — ONDANSETRON HCL 4 MG PO TABS: 4.0000 mg | ORAL_TABLET | ORAL | Status: DC | PRN

## 2019-11-13 MED ORDER — OXYCODONE-ACETAMINOPHEN 5-325 MG PO TABS: 2.0000 | ORAL_TABLET | ORAL | Status: DC | PRN

## 2019-11-13 MED ORDER — EPHEDRINE 5 MG/ML INJ: 10.0000 mg | INTRAVENOUS | Status: DC | PRN

## 2019-11-13 MED ORDER — LACTATED RINGERS IV SOLN: 500.0000 mL | INTRAVENOUS | Status: DC | PRN

## 2019-11-13 MED ORDER — ONDANSETRON HCL 4 MG/2ML IJ SOLN: 4.0000 mg | Freq: Four times a day (QID) | INTRAMUSCULAR | Status: DC | PRN

## 2019-11-13 MED ORDER — OXYCODONE-ACETAMINOPHEN 5-325 MG PO TABS: 1.0000 | ORAL_TABLET | ORAL | Status: DC | PRN

## 2019-11-13 MED ORDER — PRENATAL MULTIVITAMIN CH
1.0000 | ORAL_TABLET | Freq: Every day | ORAL | Status: DC
  Administered 2019-11-14 – 2019-11-15 (×2): 1 via ORAL
  Filled 2019-11-13 (×2): qty 1

## 2019-11-13 MED ORDER — FERROUS SULFATE 325 (65 FE) MG PO TABS
325.0000 mg | ORAL_TABLET | Freq: Two times a day (BID) | ORAL | Status: DC
  Administered 2019-11-14 – 2019-11-15 (×3): 325 mg via ORAL
  Filled 2019-11-13 (×3): qty 1

## 2019-11-13 MED ORDER — LACTATED RINGERS IV SOLN: 500.0000 mL | Freq: Once | INTRAVENOUS | Status: DC

## 2019-11-13 MED ORDER — PHENYLEPHRINE 40 MCG/ML (10ML) SYRINGE FOR IV PUSH (FOR BLOOD PRESSURE SUPPORT): 80.0000 ug | PREFILLED_SYRINGE | INTRAVENOUS | Status: DC | PRN

## 2019-11-13 MED ORDER — ZOLPIDEM TARTRATE 5 MG PO TABS: 5.0000 mg | ORAL_TABLET | Freq: Every evening | ORAL | Status: DC | PRN

## 2019-11-13 MED ORDER — SODIUM CHLORIDE 0.9% FLUSH: 3.0000 mL | INTRAVENOUS | Status: DC | PRN

## 2019-11-13 MED ORDER — WITCH HAZEL-GLYCERIN EX PADS: 1.0000 "application " | MEDICATED_PAD | CUTANEOUS | Status: DC | PRN

## 2019-11-13 MED ORDER — LACTATED RINGERS IV SOLN: INTRAVENOUS | Status: DC

## 2019-11-13 MED ORDER — METHYLERGONOVINE MALEATE 0.2 MG PO TABS: 0.2000 mg | ORAL_TABLET | ORAL | Status: DC | PRN

## 2019-11-13 MED ORDER — SOD CITRATE-CITRIC ACID 500-334 MG/5ML PO SOLN: 30.0000 mL | ORAL | Status: DC | PRN

## 2019-11-13 MED ORDER — OXYTOCIN 40 UNITS IN NORMAL SALINE INFUSION - SIMPLE MED
1.0000 m[IU]/min | INTRAVENOUS | Status: DC
  Administered 2019-11-13: 2 m[IU]/min via INTRAVENOUS
  Filled 2019-11-13: qty 1000

## 2019-11-13 MED ORDER — LIDOCAINE HCL (PF) 1 % IJ SOLN: 30.0000 mL | INTRAMUSCULAR | Status: DC | PRN

## 2019-11-13 MED ORDER — LIDOCAINE-EPINEPHRINE (PF) 2 %-1:200000 IJ SOLN
INTRAMUSCULAR | Status: DC | PRN
  Administered 2019-11-13 (×2): 2 mL via EPIDURAL

## 2019-11-13 MED ORDER — SENNOSIDES-DOCUSATE SODIUM 8.6-50 MG PO TABS
2.0000 | ORAL_TABLET | ORAL | Status: DC
  Administered 2019-11-14 (×2): 2 via ORAL
  Filled 2019-11-13 (×2): qty 2

## 2019-11-13 MED ORDER — FAMOTIDINE 20 MG PO TABS: 20.0000 mg | ORAL_TABLET | Freq: Two times a day (BID) | ORAL | Status: DC | PRN

## 2019-11-13 MED ORDER — SODIUM CHLORIDE (PF) 0.9 % IJ SOLN
INTRAMUSCULAR | Status: DC | PRN
  Administered 2019-11-13: 12 mL/h via EPIDURAL

## 2019-11-13 MED ORDER — DIBUCAINE (PERIANAL) 1 % EX OINT: 1.0000 "application " | TOPICAL_OINTMENT | CUTANEOUS | Status: DC | PRN

## 2019-11-13 MED ORDER — METHYLERGONOVINE MALEATE 0.2 MG/ML IJ SOLN
0.2000 mg | INTRAMUSCULAR | Status: DC | PRN
  Administered 2019-11-14 (×2): 0.2 mg via INTRAMUSCULAR
  Filled 2019-11-13 (×2): qty 1

## 2019-11-13 MED ORDER — MAGNESIUM HYDROXIDE 400 MG/5ML PO SUSP: 30.0000 mL | ORAL | Status: DC | PRN

## 2019-11-13 MED ORDER — DIPHENHYDRAMINE HCL 50 MG/ML IJ SOLN: 12.5000 mg | INTRAMUSCULAR | Status: DC | PRN

## 2019-11-13 MED ORDER — SODIUM CHLORIDE 0.9% FLUSH
3.0000 mL | Freq: Two times a day (BID) | INTRAVENOUS | Status: DC
  Administered 2019-11-15: 3 mL via INTRAVENOUS

## 2019-11-13 MED ORDER — BENZOCAINE-MENTHOL 20-0.5 % EX AERO
1.0000 "application " | INHALATION_SPRAY | CUTANEOUS | Status: DC | PRN
  Administered 2019-11-14: 1 via TOPICAL
  Filled 2019-11-13: qty 56

## 2019-11-13 MED ORDER — TERBUTALINE SULFATE 1 MG/ML IJ SOLN: 0.2500 mg | Freq: Once | INTRAMUSCULAR | Status: DC | PRN

## 2019-11-13 MED ORDER — ONDANSETRON HCL 4 MG/2ML IJ SOLN: 4.0000 mg | INTRAMUSCULAR | Status: DC | PRN

## 2019-11-13 MED ORDER — ACETAMINOPHEN 325 MG PO TABS: 650.0000 mg | ORAL_TABLET | ORAL | Status: DC | PRN

## 2019-11-13 MED ORDER — SIMETHICONE 80 MG PO CHEW: 80.0000 mg | CHEWABLE_TABLET | ORAL | Status: DC | PRN

## 2019-11-13 MED ORDER — IBUPROFEN 800 MG PO TABS
800.0000 mg | ORAL_TABLET | Freq: Three times a day (TID) | ORAL | Status: DC
  Administered 2019-11-13 – 2019-11-15 (×5): 800 mg via ORAL
  Filled 2019-11-13 (×5): qty 1

## 2019-11-13 MED ORDER — TETANUS-DIPHTH-ACELL PERTUSSIS 5-2.5-18.5 LF-MCG/0.5 IM SUSP: 0.5000 mL | Freq: Once | INTRAMUSCULAR | Status: DC

## 2019-11-13 MED ORDER — OXYTOCIN BOLUS FROM INFUSION
500.0000 mL | Freq: Once | INTRAVENOUS | Status: AC
  Administered 2019-11-13: 17:00:00 500 mL via INTRAVENOUS

## 2019-11-13 MED ORDER — SODIUM CHLORIDE 0.9 % IV SOLN: 250.0000 mL | INTRAVENOUS | Status: DC | PRN

## 2019-11-13 MED ORDER — ACETAMINOPHEN 325 MG PO TABS
650.0000 mg | ORAL_TABLET | ORAL | Status: DC | PRN
  Administered 2019-11-14 (×2): 650 mg via ORAL
  Filled 2019-11-13 (×2): qty 2

## 2019-11-13 NOTE — Anesthesia Preprocedure Evaluation (Signed)

## 2019-11-13 NOTE — Progress Notes (Signed)
Patient came up and we took her to the bathroom and she felt light headed. Zofran offered but she wanted to wait. Fundus firm no oozing noted.

## 2019-11-13 NOTE — Lactation Note (Signed)
This note was copied from a baby's chart. Lactation Consultation Note  Patient Name: Vicki Gill Today's Date: 11/13/2019 Reason for consult: Initial assessment;Term  P2 mother whose infant is now 73 hours old.  Mother breast fed her first child (now 34 years old) for 1 year.  Baby was swaddled and in mother's arms when I arrived.  Mother had no questions/concerns related to breast feeding.  Encouraged to feed 8-12 times/24 hours or sooner if baby shows feeding cues.  Mother is familiar with feeding cues and hand expression.  Colostrum container provided and milk storage times reviewed.  Finger feeding demonstrated.  Mom made aware of O/P services, breastfeeding support groups, community resources, and our phone # for post-discharge questions. Mother has a DEBP for home use.  Encouraged her to call for latch assistance as needed.  Father present.   Maternal Data Formula Feeding for Exclusion: No Has patient been taught Hand Expression?: Yes Does the patient have breastfeeding experience prior to this delivery?: Yes  Feeding Feeding Type: Breast Fed  LATCH Score Latch: Grasps breast easily, tongue down, lips flanged, rhythmical sucking.  Audible Swallowing: Spontaneous and intermittent  Type of Nipple: Everted at rest and after stimulation  Comfort (Breast/Nipple): Soft / non-tender  Hold (Positioning): No assistance needed to correctly position infant at breast.  LATCH Score: 10  Interventions    Lactation Tools Discussed/Used     Consult Status Consult Status: Follow-up Date: 11/14/19 Follow-up type: In-patient    Milessa Hogan R Adolpho Meenach 11/13/2019, 8:07 PM

## 2019-11-13 NOTE — H&P (Signed)
34 y.o. [redacted]w[redacted]d  G2P1001 comes in for post dates induction.  Otherwise has good fetal movement and no bleeding.  Past Medical History:  Diagnosis Date  . Vaginal Pap smear, abnormal     Past Surgical History:  Procedure Laterality Date  . COLPOSCOPY  2012    OB History  Gravida Para Term Preterm AB Living  2 1 1  0 0 1  SAB TAB Ectopic Multiple Live Births  0 0 0 0 1    # Outcome Date GA Lbr Len/2nd Weight Sex Delivery Anes PTL Lv  2 Current           1 Term 08/12/17 [redacted]w[redacted]d / 01:49 3365 g M Vag-Spont EPI  LIV    Social History   Socioeconomic History  . Marital status: Married    Spouse name: Not on file  . Number of children: Not on file  . Years of education: Not on file  . Highest education level: Not on file  Occupational History  . Not on file  Tobacco Use  . Smoking status: Never Smoker  . Smokeless tobacco: Never Used  Substance and Sexual Activity  . Alcohol use: Yes    Comment: none with pregnancy  . Drug use: No  . Sexual activity: Not on file  Other Topics Concern  . Not on file  Social History Narrative   ** Merged History Encounter **       Social Determinants of Health   Financial Resource Strain:   . Difficulty of Paying Living Expenses: Not on file  Food Insecurity:   . Worried About [redacted]w[redacted]d in the Last Year: Not on file  . Ran Out of Food in the Last Year: Not on file  Transportation Needs:   . Lack of Transportation (Medical): Not on file  . Lack of Transportation (Non-Medical): Not on file  Physical Activity:   . Days of Exercise per Week: Not on file  . Minutes of Exercise per Session: Not on file  Stress:   . Feeling of Stress : Not on file  Social Connections:   . Frequency of Communication with Friends and Family: Not on file  . Frequency of Social Gatherings with Friends and Family: Not on file  . Attends Religious Services: Not on file  . Active Member of Clubs or Organizations: Not on file  . Attends Programme researcher, broadcasting/film/video Meetings: Not on file  . Marital Status: Not on file  Intimate Partner Violence:   . Fear of Current or Ex-Partner: Not on file  . Emotionally Abused: Not on file  . Physically Abused: Not on file  . Sexually Abused: Not on file   Patient has no known allergies.    Prenatal Transfer Tool  Maternal Diabetes: No Genetic Screening: Normal Maternal Ultrasounds/Referrals: Normal Fetal Ultrasounds or other Referrals:  None Maternal Substance Abuse:  No Significant Maternal Medications:  None Significant Maternal Lab Results: Group B Strep negative and Rh negative  Other PNC: uncomplicated.    Vitals:   11/13/19 0823 11/13/19 0829  BP: (!) 153/92   Pulse: 95   Temp: 98 F (36.7 C)   TempSrc: Oral   Weight:  99.2 kg  Height:  5\' 8"  (1.727 m)    Lungs/Cor:  NAD Abdomen:  soft, gravid Ex:  no cords, erythema SVE:  3/90/-2 FHTs:  140s, good STV, NST R; Cat 1 tracing. Toco:  q occ   A/P  Post dates induction.  BP initially high on admit-  was normal in office.  If severe range BPs, will start magneium sulfate.  In meantime will go ahead and sent [place] labs.  GBS neg.  RI.   Vicki Gill

## 2019-11-13 NOTE — Anesthesia Procedure Notes (Signed)
Epidural Patient location during procedure: OB Start time: 11/13/2019 12:45 PM End time: 11/13/2019 1:00 PM  Staffing Anesthesiologist: Elmer Picker, MD Performed: anesthesiologist   Preanesthetic Checklist Completed: patient identified, IV checked, risks and benefits discussed, monitors and equipment checked, pre-op evaluation and timeout performed  Epidural Patient position: sitting Prep: DuraPrep and site prepped and draped Patient monitoring: continuous pulse ox, blood pressure, heart rate and cardiac monitor Approach: midline Location: L3-L4 Injection technique: LOR air  Needle:  Needle type: Tuohy  Needle gauge: 17 G Needle length: 9 cm Needle insertion depth: 5 cm Catheter type: closed end flexible Catheter size: 19 Gauge Catheter at skin depth: 11 cm Test dose: negative  Assessment Sensory level: T8 Events: blood not aspirated, injection not painful, no injection resistance, no paresthesia and negative IV test  Additional Notes Patient identified. Risks/Benefits/Options discussed with patient including but not limited to bleeding, infection, nerve damage, paralysis, failed block, incomplete pain control, headache, blood pressure changes, nausea, vomiting, reactions to medication both or allergic, itching and postpartum back pain. Confirmed with bedside nurse the patient's most recent platelet count. Confirmed with patient that they are not currently taking any anticoagulation, have any bleeding history or any family history of bleeding disorders. Patient expressed understanding and wished to proceed. All questions were answered. Sterile technique was used throughout the entire procedure. Please see nursing notes for vital signs. Test dose was given through epidural catheter and negative prior to continuing to dose epidural or start infusion. Warning signs of high block given to the patient including shortness of breath, tingling/numbness in hands, complete motor block,  or any concerning symptoms with instructions to call for help. Patient was given instructions on fall risk and not to get out of bed. All questions and concerns addressed with instructions to call with any issues or inadequate analgesia.  Reason for block:procedure for pain

## 2019-11-14 LAB — CBC
HCT: 26.7 % — ABNORMAL LOW (ref 36.0–46.0)
HCT: 25 % — ABNORMAL LOW (ref 36.0–46.0)
Hemoglobin: 9.3 g/dL — ABNORMAL LOW (ref 12.0–15.0)
Hemoglobin: 8.9 g/dL — ABNORMAL LOW (ref 12.0–15.0)
MCH: 31.4 pg (ref 26.0–34.0)
MCH: 32 pg (ref 26.0–34.0)
MCHC: 34.8 g/dL (ref 30.0–36.0)
MCHC: 35.6 g/dL (ref 30.0–36.0)
MCV: 90.2 fL (ref 80.0–100.0)
MCV: 89.9 fL (ref 80.0–100.0)
Platelets: 139 10*3/uL — ABNORMAL LOW (ref 150–400)
Platelets: 120 10*3/uL — ABNORMAL LOW (ref 150–400)
RBC: 2.96 MIL/uL — ABNORMAL LOW (ref 3.87–5.11)
RBC: 2.78 MIL/uL — ABNORMAL LOW (ref 3.87–5.11)
RDW: 12.4 % (ref 11.5–15.5)
RDW: 12.3 % (ref 11.5–15.5)
WBC: 15.1 10*3/uL — ABNORMAL HIGH (ref 4.0–10.5)
WBC: 15.6 10*3/uL — ABNORMAL HIGH (ref 4.0–10.5)
nRBC: 0 % (ref 0.0–0.2)
nRBC: 0 % (ref 0.0–0.2)

## 2019-11-14 MED ORDER — OXYTOCIN 40 UNITS IN NORMAL SALINE INFUSION - SIMPLE MED
8.0000 [IU]/h | INTRAVENOUS | Status: DC
  Administered 2019-11-14: 01:00:00 8 [IU]/h via INTRAVENOUS
  Filled 2019-11-14: qty 1000

## 2019-11-14 MED ORDER — OXYTOCIN 40 UNITS IN NORMAL SALINE INFUSION - SIMPLE MED
5.0000 [IU]/h | INTRAVENOUS | Status: DC
  Administered 2019-11-14: 06:00:00 5 [IU]/h via INTRAVENOUS
  Filled 2019-11-14: qty 1000

## 2019-11-14 MED ORDER — BUTORPHANOL TARTRATE 1 MG/ML IJ SOLN
1.0000 mg | Freq: Once | INTRAMUSCULAR | Status: AC
  Administered 2019-11-14: 04:00:00 1 mg via INTRAVENOUS
  Filled 2019-11-14: qty 1

## 2019-11-14 NOTE — Lactation Note (Signed)
This note was copied from a baby's chart. Lactation Consultation Note  Patient Name: Vicki Gill Today's Date: 11/14/2019 Reason for consult: Follow-up assessment;Term  LC in to visit with P2 Mom of term baby at 82 hrs old.  Baby at 1.2% weight loss and has had one void, no stool yet.  Baby has been to the breast numerous times with latch scores of 10.  Mom denies having any difficulty latching and breastfeeding baby.    Encouraged keeping baby STS as much as possible and offering breast often with cues.   To ask for assistance prn.   Consult Status Consult Status: Follow-up Date: 11/15/19 Follow-up type: In-patient    Vicki Gill 11/14/2019, 12:53 PM

## 2019-11-14 NOTE — Progress Notes (Signed)
Patient called out for nurse. This RN entered room and patient stated she was changing her pad when her bakri balloon fell out. Fundal check done. Patient is firm and 3 below umbilicus. Bleeding is scant. Dr. Tenny Craw notified and aware. OK to d/c foley at this time as bleeding appears to be stable. Will continue to monitor.

## 2019-11-14 NOTE — Progress Notes (Signed)
RN checked patient around 2230 and noticed multiple kiwi sized clots but patient was firm with fundal rub. RN went back to check patient around MN and pad was saturated with multiple clots. RN paged Dr. Henderson Cloud and was ordered to start pitocin and give methergine IM as well as CBC ordered. RN accessed pt 1 hour after meds given and bleeding looked much better. RN went into room around 0300 to check patient again and patient had multiple clots and trickle even with firm fundal rub. Dr. Henderson Cloud notified and came in to examine patient. Two pieces of placenta were removed with manual exploration. After examination pt was transferred to [facility] specialty care with [facility] placed.  Shari Prows

## 2019-11-14 NOTE — Progress Notes (Signed)
Called about pt- bleeding.  At about MN, I was called about pt who had had several kiwi sized clots since delivery and total blood loss at that time was about 700 cc. Pt's vitals were stable and she did not have any dizziness on standing.    I instructed nurses to give methergine, start pitocin back at 200 cc per hour and get CBC.  CBC at that time was about 9 with good platelets.  Bleeding slowed and was less than 50 cc that hour.  At about 3 am I was called again for increased bleeding, now more trickling.  Massage revealed good tone.  I came in to examine pt.  She was alert and oriented without dizziness and had good color.  After consenting pt and giving her stadol, using manual exploration, I removed about 300 cc of clot from lower uterine segment and a small piece of placenta about 2 cm in size.  Further exploration to top of fundus removed another small amount of clot but no further tissue.  Bleeding was reduced to normal lochia at that point.  Vitals:   11/13/19 2216 11/13/19 2356 11/14/19 0121 11/14/19 0319  BP: 104/72 133/76 122/85 (!) 144/87  Pulse: (!) 106 83 88 86  Resp: 20 18 18    Temp: 97.7 F (36.5 C) 98.9 F (37.2 C)    TempSrc: Oral Oral    SpO2: 98% 100% 99% 97%  Weight:      Height:       Results for orders placed or performed during the hospital encounter of 11/13/19 (from the past 24 hour(s))  Type and screen [place]     Status: None (Preliminary result)   Collection Time: 11/13/19  8:43 AM  Result Value Ref Range   ABO/RH(D) O NEG    Antibody Screen POS    Sample Expiration 11/16/2019,2359    Antibody Identification      PASSIVELY ACQUIRED ANTI-D Performed at [facility], [address] [place], [place] [address]    Unit Number [id number]    Blood Component Type RED CELLS,LR    Unit division 00    Status of Unit ALLOCATED    Transfusion Status OK TO TRANSFUSE    Crossmatch Result COMPATIBLE    Unit Number [id number]    Blood Component Type RED CELLS,LR    Unit division 00    Status of Unit ALLOCATED    Transfusion Status OK TO TRANSFUSE    Crossmatch Result COMPATIBLE   CBC     Status: None   Collection Time: 11/13/19  8:58 AM  Result Value Ref Range   WBC 10.0 4.0 - 10.5 K/uL   RBC 3.96 3.87 - 5.11 MIL/uL   Hemoglobin 12.5 12.0 - 15.0 g/dL   HCT 36.0 36.0 - 46.0 %   MCV 90.9 80.0 - 100.0 fL   MCH 31.6 26.0 - 34.0 pg   MCHC 34.7 30.0 - 36.0 g/dL   RDW 12.2 11.5 - 15.5 %   Platelets 155 150 - 400 K/uL   nRBC 0.0 0.0 - 0.2 %  RPR     Status: None   Collection Time: 11/13/19  8:58 AM  Result Value Ref Range   RPR Ser Ql NON REACTIVE NON REACTIVE  Comprehensive metabolic panel     Status: Abnormal   Collection Time: 11/13/19  8:58 AM  Result Value Ref Range   Sodium 136 135 - 145 mmol/L   Potassium 3.6 3.5 - 5.1 mmol/L  Chloride 106 98 - 111 mmol/L   CO2 19 (L) 22 - 32 mmol/L   Glucose, Bld 103 (H) 70 - 99 mg/dL   BUN 9 6 - 20 mg/dL   Creatinine, Ser 1.01 0.44 - 1.00 mg/dL   Calcium 8.6 (L) 8.9 - 10.3 mg/dL   Total Protein 5.5 (L) 6.5 - 8.1 g/dL   Albumin 3.0 (L) 3.5 - 5.0 g/dL   AST 21 15 - 41 U/L   ALT 21 0 - 44 U/L   Alkaline Phosphatase 111 38 - 126 U/L   Total Bilirubin 1.1 0.3 - 1.2 mg/dL   GFR calc non Af Amer >60 >60 mL/min   GFR calc Af Amer >60 >60 mL/min   Anion gap 11 5 - 15  Protein / creatinine ratio, urine     Status: None   Collection Time: 11/13/19 12:00 PM  Result Value Ref Range   Creatinine, Urine 31.82 mg/dL   Total Protein, Urine <6 mg/dL   Protein Creatinine Ratio        0.00 - 0.15 mg/mg[Cre]  CBC     Status: Abnormal   Collection Time: 11/13/19  6:13 PM  Result Value Ref Range   WBC 16.4 (H) 4.0 - 10.5 K/uL   RBC 4.01 3.87 - 5.11 MIL/uL   Hemoglobin 12.6 12.0 - 15.0 g/dL   HCT 75.1 02.5 - 85.2 %   MCV 89.8 80.0 - 100.0 fL   MCH 31.4 26.0 - 34.0 pg   MCHC 35.0 30.0 - 36.0 g/dL   RDW 77.8 24.2 - 35.3 %   Platelets 160 150 - 400 K/uL   nRBC 0.0 0.0 -  0.2 %  CBC     Status: Abnormal   Collection Time: 11/14/19 12:28 AM  Result Value Ref Range   WBC 15.1 (H) 4.0 - 10.5 K/uL   RBC 2.96 (L) 3.87 - 5.11 MIL/uL   Hemoglobin 9.3 (L) 12.0 - 15.0 g/dL   HCT 61.4 (L) 43.1 - 54.0 %   MCV 90.2 80.0 - 100.0 fL   MCH 31.4 26.0 - 34.0 pg   MCHC 34.8 30.0 - 36.0 g/dL   RDW 08.6 76.1 - 95.0 %   Platelets 139 (L) 150 - 400 K/uL   nRBC 0.0 0.0 - 0.2 %    Methergine given a second time and pitocin continued in IV.   Loney Laurence

## 2019-11-14 NOTE — Anesthesia Postprocedure Evaluation (Signed)
Anesthesia Post Note  Patient: Vicki Gill  Procedure(s) Performed: AN AD HOC LABOR EPIDURAL     Patient location during evaluation: Mother Baby Anesthesia Type: Epidural Level of consciousness: awake and alert Pain management: pain level controlled Vital Signs Assessment: post-procedure vital signs reviewed and stable Respiratory status: spontaneous breathing, nonlabored ventilation and respiratory function stable Cardiovascular status: stable Postop Assessment: no headache, no backache and epidural receding Anesthetic complications: no    Last Vitals:  Vitals:   11/14/19 0515 11/14/19 0815  BP: 140/84 (!) 143/82  Pulse: 87 98  Resp: 19 20  Temp: 36.7 C 37.1 C  SpO2:  100%    Last Pain:  Vitals:   11/14/19 0815  TempSrc: Oral  PainSc:    Pain Goal:                   Eryc Bodey

## 2019-11-14 NOTE — Progress Notes (Signed)
Went back in to check on pt and bleeding was again trickling.\ Manual exploration done and another small piece of placenta or decidua was removed.  Speculum exam revealed no tears and intact cervix with bleeding from uterus.    Bakri balloon placed in LUS (unable to get further into fundus as cavity was not large) with 300 cc water instilled in balloon.    Bakri to gravity drainage.    Pt transferred to [facility].

## 2019-11-14 NOTE — Progress Notes (Signed)
Post Partum Day 1 Subjective: Pt c/o swelling and cramping. Minimally ambulating d/t foley and bakri. Breast feeding   Objective: Blood pressure (!) 146/80, pulse 91, temperature 98.4 F (36.9 C), temperature source Oral, resp. rate 18, height 5\' 8"  (1.727 m), weight 99.2 kg, last menstrual period 02/01/2019, SpO2 97 %, unknown if currently breastfeeding. Total I/O In: 1587.5 [P.O.:720; I.V.:867.5] Out: 450 [Urine:425; Drains:25]  Physical Exam:  General: alert, cooperative and appears stated age Lochia: appropriate Uterine Fundus: firm DVT Evaluation: No evidence of DVT seen on physical exam.  Recent Labs    11/14/19 0028 11/14/19 0604  HGB 9.3* 8.9*  HCT 26.7* 25.0*    Assessment/Plan: Continue current care. Per Dr. 11/16/19, will leave Bakri in situ until tomorrow. 25 cc out of bakri this shift Hgb 9.3-->8.9, will repeat tomorrow as this may not reflect actual levels.  SCDs for DVT prophylaxis Stop pit infusion    LOS: 1 day   Henderson Cloud 11/14/2019, 12:06 PM

## 2019-11-15 LAB — CBC
HCT: 21.7 % — ABNORMAL LOW (ref 36.0–46.0)
Hemoglobin: 7.5 g/dL — ABNORMAL LOW (ref 12.0–15.0)
MCH: 32.2 pg (ref 26.0–34.0)
MCHC: 34.6 g/dL (ref 30.0–36.0)
MCV: 93.1 fL (ref 80.0–100.0)
Platelets: 138 10*3/uL — ABNORMAL LOW (ref 150–400)
RBC: 2.33 MIL/uL — ABNORMAL LOW (ref 3.87–5.11)
RDW: 12.9 % (ref 11.5–15.5)
WBC: 11.2 10*3/uL — ABNORMAL HIGH (ref 4.0–10.5)
nRBC: 0 % (ref 0.0–0.2)

## 2019-11-15 LAB — BIRTH TISSUE RECOVERY COLLECTION (PLACENTA DONATION)

## 2019-11-15 NOTE — Discharge Summary (Addendum)
Obstetric Discharge Summary Reason for Admission:  induction Prenatal Procedures: NST Intrapartum Procedures: spontaneous vaginal delivery Postpartum Procedures: none Complications-Operative and Postpartum: hemorrhage Hemoglobin  Date Value Ref Range Status  11/14/2019 8.9 (L) 12.0 - 15.0 g/dL Final   HCT  Date Value Ref Range Status  11/14/2019 25.0 (L) 36.0 - 46.0 % Final     Discharge Diagnoses: Term Pregnancy-delivered, gestational hypertension, pp hemorrhage  Discharge Information: Date: 11/15/2019 Activity: pelvic rest Diet: routine Medications: Ibuprofen and Iron Condition: stable Instructions: refer to practice specific booklet Discharge to: home Follow-up Information    Carrington Clamp, MD Follow up in 2 week(s).   Specialty: Obstetrics and Gynecology Why: BP check Contact information: [address] Dorothyann Gibbs [place] [address] [phone]           Newborn Data: Live born female  Birth Weight: 8 lb 8.3 oz (3865 g) APGAR: 8, 9  Newborn Delivery   Birth date/time: 11/13/2019 16:43:00 Delivery type: Vaginal, Spontaneous      Home with mother.  Vicki Gill 11/15/2019, 7:41 AM

## 2019-11-15 NOTE — Progress Notes (Signed)
Note: 300cc drained from bakri balloon.

## 2019-11-15 NOTE — Progress Notes (Signed)
Patient is eating, ambulating, voiding.  Pain control is good.  Vitals:   11/14/19 1526 11/14/19 1930 11/14/19 2331 11/15/19 0414  BP: (!) 151/90 (!) 146/89 132/72 (!) 143/85  Pulse: 94 100 97 97  Resp: 18 18 18 18   Temp: 98 F (36.7 C) 98.1 F (36.7 C) 98.3 F (36.8 C) 97.6 F (36.4 C)  TempSrc: Oral Oral Oral Oral  SpO2: 98% 100% 100% 100%  Weight:      Height:        Fundus firm Perineum without swelling.  Lab Results  Component Value Date   WBC 15.6 (H) 11/14/2019   HGB 8.9 (L) 11/14/2019   HCT 25.0 (L) 11/14/2019   MCV 89.9 11/14/2019   PLT 120 (L) 11/14/2019    --/--/O NEG (01/12 0843)/RI  A/P Post partum day 2.  PP hemorrhage - bakri fell out last night and bleeding is normal lochia.   Mild anemia- iron. BPs have been stable with only one 150/90 yesterday at about 15:30. Baby is RH NEG- Rhogam not needed. Routine care.  Expect d/c today.    04-09-2000

## 2019-11-15 NOTE — Lactation Note (Signed)
This note was copied from a baby's chart. Lactation Consultation Note  Patient Name: Vicki Gill Today's Date: 11/15/2019 Reason for consult: Follow-up assessment Baby is 39 hours old/6% weight loss.  Baby is currently latched well to breast.  Mom feels good about feedings.  Discussed milk coming to volume and the prevention and treatment of engorgement.  She has a breast pump at home.  No questions or concerns.  Reviewed outpatient services and encouraged to call prn.  Maternal Data    Feeding    LATCH Score                   Interventions    Lactation Tools Discussed/Used     Consult Status Consult Status: Complete Follow-up type: Call as needed    Huston Foley 11/15/2019, 8:38 AM

## 2019-11-15 NOTE — Progress Notes (Signed)
Pt given discharge instructions. All questions answered. IV discontinued. Pt is staying in room while her infant is still admitted.

## 2019-11-16 LAB — TYPE AND SCREEN
ABO/RH(D): O NEG
Antibody Screen: POSITIVE
Unit division: 0
Unit division: 0

## 2019-11-16 LAB — BPAM RBC
Blood Product Expiration Date: 202102102359
Blood Product Expiration Date: 202102112359
Unit Type and Rh: 9500
Unit Type and Rh: 9500

## 2020-09-13 DIAGNOSIS — F988 Other specified behavioral and emotional disorders with onset usually occurring in childhood and adolescence: Secondary | ICD-10-CM | POA: Insufficient documentation

## 2023-03-01 ENCOUNTER — Encounter: Payer: Self-pay | Admitting: Adult Health

## 2023-03-01 ENCOUNTER — Ambulatory Visit (INDEPENDENT_AMBULATORY_CARE_PROVIDER_SITE_OTHER): Payer: BC Managed Care – PPO | Admitting: Adult Health

## 2023-03-01 VITALS — BP 130/88 | Ht 68.0 in | Wt 190.0 lb

## 2023-03-01 DIAGNOSIS — F331 Major depressive disorder, recurrent, moderate: Secondary | ICD-10-CM | POA: Diagnosis not present

## 2023-03-01 DIAGNOSIS — F411 Generalized anxiety disorder: Secondary | ICD-10-CM | POA: Diagnosis not present

## 2023-03-01 DIAGNOSIS — F909 Attention-deficit hyperactivity disorder, unspecified type: Secondary | ICD-10-CM | POA: Diagnosis not present

## 2023-03-01 MED ORDER — SERTRALINE HCL 50 MG PO TABS
50.0000 mg | ORAL_TABLET | Freq: Every day | ORAL | 2 refills | Status: DC
Start: 2023-03-01 — End: 2023-03-01

## 2023-03-01 MED ORDER — SERTRALINE HCL 50 MG PO TABS: 50.0000 mg | ORAL_TABLET | Freq: Every day | ORAL | 2 refills | Status: DC

## 2023-03-01 NOTE — Addendum Note (Signed)
Addended by: Dorothyann Gibbs on: 03/01/2023 12:30 PM   Modules accepted: Orders

## 2023-03-01 NOTE — Progress Notes (Signed)
[facility] MD/PA/NP Initial Note  03/01/2023 11:22 AM Vicki Gill  MRN:  [id number]  Chief Complaint:   HPI:   Patient seen today for initial psychiatric evaluation.   Self referral.  Describes mood today as "not the best". Pleasant. Denies tearfulness. Mood symptoms - reports depression, anxiety, and irritability - more anxious overall. Denies worry, rumination, and over thinking. Denies obsessive thoughts and acts. Reports mood fluctuations - twice a month - "more hormonal". Stating "I feel like I need help with the PMDD". Has been tracking her mood and menstrual cycle over the past 2 months. Reports a consistent 2 week period of depression and low mood". Has been working with GYN and has tried birth control without any success. Has tried Lexapro and Prozac previously - not effective. Willing to consider other options. Stable interest and motivation. Taking medications as prescribed.  Energy levels stable. Active, does not have a regular exercise routine - 4 days a week. Enjoys some usual interests and activities. Married x 9 years - 2 children 5 and 3. No family local. Spending time with family. Appetite adequate. Weight gain. Sleeps well most nights. Averages 8 hours. Focus and concentration stable - taking Adderall. Completing tasks. Managing aspects of household. Works full time - remote. Denies SI or HI.  Denies AH or VH. Denies self harm. Denies substance use.   Previous medication trials:  Buspar, Lexapro, Prozac, Adderall  Visit Diagnosis: No diagnosis found.  Past Psychiatric History: Denies psychiatric hospitalization.   Past Medical History:  Past Medical History:  Diagnosis Date   Vaginal Pap smear, abnormal     Past Surgical History:  Procedure Laterality Date   COLPOSCOPY  2012    Family Psychiatric History: Denies any family history of mental illness.   Family History: No family history on file.  Social History:  Social History   Socioeconomic History    Marital status: Married    Spouse name: Not on file   Number of children: Not on file   Years of education: Not on file   Highest education level: Not on file  Occupational History   Not on file  Tobacco Use   Smoking status: Never   Smokeless tobacco: Never  Vaping Use   Vaping Use: Never used  Substance and Sexual Activity   Alcohol use: Yes    Comment: none with pregnancy   Drug use: No   Sexual activity: Not on file  Other Topics Concern   Not on file  Social History Narrative   ** Merged History Encounter **       Social Determinants of Health   Financial Resource Strain: Not on file  Food Insecurity: Not on file  Transportation Needs: Not on file  Physical Activity: Not on file  Stress: Not on file  Social Connections: Not on file    Allergies: No Known Allergies  Metabolic Disorder Labs: No results found for: "HGBA1C", "MPG" No results found for: "PROLACTIN" No results found for: "CHOL", "TRIG", "HDL", "CHOLHDL", "VLDL", "LDLCALC" No results found for: "TSH"  Therapeutic Level Labs: No results found for: "LITHIUM" No results found for: "VALPROATE" No results found for: "CBMZ"  Current Medications: Current Outpatient Medications  Medication Sig Dispense Refill   acetaminophen (TYLENOL) 500 MG tablet Take 1,000 mg by mouth every 6 (six) hours as needed for mild pain or headache.     famotidine (PEPCID) 20 MG tablet Take 20 mg by mouth 2 (two) times daily as needed for heartburn.  Prenatal Vit-Fe Fumarate-FA (PRENATAL MULTIVITAMIN) TABS tablet Take 1 tablet by mouth daily at 12 noon.      No current facility-administered medications for this visit.    Medication Side Effects: none  Orders placed this visit:  No orders of the defined types were placed in this encounter.   Psychiatric Specialty Exam:  Review of Systems  Musculoskeletal:  Negative for gait problem.  Neurological:  Negative for tremors.  Psychiatric/Behavioral:         Please  refer to HPI    unknown if currently breastfeeding.There is no height or weight on file to calculate BMI.  General Appearance: Casual and Neat  Eye Contact:  Good  Speech:  Clear and Coherent and Normal Rate  Volume:  Normal  Mood:  Euthymic  Affect:  Appropriate and Congruent  Thought Process:  Coherent and Descriptions of Associations: Intact  Orientation:  Full (Time, Place, and Person)  Thought Content: Logical   Suicidal Thoughts:  No  Homicidal Thoughts:  No  Memory:  WNL  Judgement:  Good  Insight:  Good  Psychomotor Activity:  Normal  Concentration:  Concentration: Good and Attention Span: Good  Recall:  Good  Fund of Knowledge: Good  Language: Good  Assets:  Communication Skills Desire for Improvement Financial Resources/Insurance Housing Intimacy Leisure Time Physical Health Resilience Social Support Talents/Skills Transportation Vocational/Educational  ADL's:  Intact  Cognition: WNL  Prognosis:  Good   Screenings: MDQ  Receiving Psychotherapy: Yes   Treatment Plan/Recommendations:   Plan:  PDMP reviewed  Continue Adderall XR 30mg  BID  D/C Lexapro 10mg  daily x 5 months Add Zoloft 50mg  daily - PMDD  Consider low dose of Abilify for PMDD  RTC 4 weeks  Patient advised to contact office with any questions, adverse effects, or acute worsening in signs and symptoms.   Time spent with patient was 60 minutes. Greater than 50% of face to face time with patient was spent on counseling and coordination of care.     Dorothyann Gibbs, NP

## 2023-03-29 ENCOUNTER — Encounter: Payer: Self-pay | Admitting: Adult Health

## 2023-03-29 ENCOUNTER — Ambulatory Visit (INDEPENDENT_AMBULATORY_CARE_PROVIDER_SITE_OTHER): Payer: BC Managed Care – PPO | Admitting: Adult Health

## 2023-03-29 DIAGNOSIS — F909 Attention-deficit hyperactivity disorder, unspecified type: Secondary | ICD-10-CM

## 2023-03-29 DIAGNOSIS — F411 Generalized anxiety disorder: Secondary | ICD-10-CM

## 2023-03-29 DIAGNOSIS — F331 Major depressive disorder, recurrent, moderate: Secondary | ICD-10-CM

## 2023-03-29 MED ORDER — SERTRALINE HCL 100 MG PO TABS
100.0000 mg | ORAL_TABLET | Freq: Every day | ORAL | 2 refills | Status: DC
Start: 2023-03-29 — End: 2023-05-24

## 2023-03-29 NOTE — Progress Notes (Signed)
Vicki Gill [id number] 11/19/1985 37 y.o.  Subjective:   Patient ID:  Vicki Gill is a 37 y.o. (DOB 1986/04/09) female.  Chief Complaint: No chief complaint on file.   HPI Vicki Gill presents to the office today for follow-up of MDD, GAD, and ADD.  Describes mood today as "not the best". Pleasant. Denies tearfulness. Mood symptoms - denies depression. Reports decreased anxiety and irritability. Denies worry, rumination, and over thinking. Denies obsessive thoughts and acts. Reports decreased mood fluctuations. Stating "I feel like I'm doing better". Feels like the addition of Zoloft has been helpful. Has tapered of of Lexapro and stopped birth control. Would like to give the change a few months before considering other options. Stable interest and motivation. Taking medications as prescribed.  Energy levels stable. Active, has a regular exercise routine - 4 days a week. Enjoys some usual interests and activities. Married x 9 years - 2 children 5 and 3. No family local. Spending time with family. Appetite adequate. Weight gain. Sleeps well most nights. Averages 8 hours. Focus and concentration stable - taking Adderall. Completing tasks. Managing aspects of household. Works full time - remote. Denies SI or HI.  Denies AH or VH. Denies self harm. Denies substance use.   Previous medication trials:  Buspar, Lexapro, Prozac, Adderall  Review of Systems:  Review of Systems  Musculoskeletal:  Negative for gait problem.  Neurological:  Negative for tremors.  Psychiatric/Behavioral:         Please refer to HPI    Medications: I have reviewed the patient's current medications.  Current Outpatient Medications  Medication Sig Dispense Refill   acetaminophen (TYLENOL) 500 MG tablet Take 1,000 mg by mouth every 6 (six) hours as needed for mild pain or headache.     famotidine (PEPCID) 20 MG tablet Take 20 mg by mouth 2 (two) times daily as needed for heartburn.      Prenatal Vit-Fe Fumarate-FA  (PRENATAL MULTIVITAMIN) TABS tablet Take 1 tablet by mouth daily at 12 noon.      sertraline (ZOLOFT) 50 MG tablet Take 1 tablet (50 mg total) by mouth daily. 30 tablet 2   No current facility-administered medications for this visit.    Medication Side Effects: None  Allergies: No Known Allergies  Past Medical History:  Diagnosis Date   Vaginal Pap smear, abnormal     Past Medical History, Surgical history, Social history, and Family history were reviewed and updated as appropriate.   Please see review of systems for further details on the patient's review from today.   Objective:   Physical Exam:  There were no vitals taken for this visit.  Physical Exam Constitutional:      General: She is not in acute distress. Musculoskeletal:        General: No deformity.  Neurological:     Mental Status: She is alert and oriented to person, place, and time.     Coordination: Coordination normal.  Psychiatric:        Attention and Perception: Attention and perception normal. She does not perceive auditory or visual hallucinations.        Mood and Affect: Mood normal. Mood is not anxious or depressed. Affect is not labile, blunt, angry or inappropriate.        Speech: Speech normal.        Behavior: Behavior normal.        Thought Content: Thought content normal. Thought content is not paranoid or delusional. Thought content does not include homicidal or suicidal  ideation. Thought content does not include homicidal or suicidal plan.        Cognition and Memory: Cognition and memory normal.        Judgment: Judgment normal.     Comments: Insight intact     Lab Review:     Component Value Date/Time   NA 136 11/13/2019 0858   K 3.6 11/13/2019 0858   CL 106 11/13/2019 0858   CO2 19 (L) 11/13/2019 0858   GLUCOSE 103 (H) 11/13/2019 0858   BUN 9 11/13/2019 0858   CREATININE 0.67 11/13/2019 0858   CALCIUM 8.6 (L) 11/13/2019 0858   PROT 5.5 (L) 11/13/2019 0858   ALBUMIN 3.0 (L)  11/13/2019 0858   AST 21 11/13/2019 0858   ALT 21 11/13/2019 0858   ALKPHOS 111 11/13/2019 0858   BILITOT 1.1 11/13/2019 0858   GFRNONAA >60 11/13/2019 0858   GFRAA >60 11/13/2019 0858       Component Value Date/Time   WBC 11.2 (H) 11/15/2019 0713   RBC 2.33 (L) 11/15/2019 0713   HGB 7.5 (L) 11/15/2019 0713   HCT 21.7 (L) 11/15/2019 0713   PLT 138 (L) 11/15/2019 0713   MCV 93.1 11/15/2019 0713   MCH 32.2 11/15/2019 0713   MCHC 34.6 11/15/2019 0713   RDW 12.9 11/15/2019 0713   LYMPHSABS 2.3 08/14/2017 0526   MONOABS 0.4 08/14/2017 0526   EOSABS 0.2 08/14/2017 0526   BASOSABS 0.0 08/14/2017 0526    No results found for: "POCLITH", "LITHIUM"   No results found for: "PHENYTOIN", "PHENOBARB", "VALPROATE", "CBMZ"   .res Assessment: Plan:    Plan:  PDMP reviewed  Continue Adderall XR 30mg  BID  Zoloft 50mg  to 100mg  daily - PMDD  Consider low dose of Abilify for PMDD  RTC 8 weeks  Patient advised to contact office with any questions, adverse effects, or acute worsening in signs and symptoms.    There are no diagnoses linked to this encounter.   Please see After Visit Summary for patient specific instructions.  Future Appointments  Date Time Provider Department Center  03/29/2023  1:40 PM Aleana Fifita, Thereasa Solo, NP [facility] None    No orders of the defined types were placed in this encounter.   -------------------------------

## 2023-05-12 DIAGNOSIS — F3281 Premenstrual dysphoric disorder: Secondary | ICD-10-CM | POA: Insufficient documentation

## 2023-05-24 ENCOUNTER — Encounter: Payer: Self-pay | Admitting: Adult Health

## 2023-05-24 ENCOUNTER — Ambulatory Visit: Payer: BC Managed Care – PPO | Admitting: Adult Health

## 2023-05-24 DIAGNOSIS — F331 Major depressive disorder, recurrent, moderate: Secondary | ICD-10-CM | POA: Diagnosis not present

## 2023-05-24 DIAGNOSIS — F411 Generalized anxiety disorder: Secondary | ICD-10-CM | POA: Diagnosis not present

## 2023-05-24 MED ORDER — SERTRALINE HCL 100 MG PO TABS
100.0000 mg | ORAL_TABLET | Freq: Two times a day (BID) | ORAL | 3 refills | Status: AC
Start: 2023-05-24 — End: ?

## 2023-05-24 NOTE — Progress Notes (Signed)
[name] [id number] [birth year] 36 y.o.  Subjective:   Patient ID:  Vicki Gill is a 37 y.o. (DOB 17-Aug-1986) female.  Chief Complaint: No chief complaint on file.   HPI Vicki Gill presents to the office today for follow-up of MDD and GAD.  Describes mood today as "ok". Pleasant. Denies tearfulness. Mood symptoms - denies depression. Reports decreased anxiety and irritability - "much lower". Denies worry, rumination, and over thinking. Denies obsessive thoughts and acts. Reports decreased mood fluctuations. Stating "I feel like I'm doing pretty good". Feels like current medication regimen is working well. Stable interest and motivation. Taking medications as prescribed.  Energy levels stable. Active, has a regular exercise routine - 4 days a week. Enjoys some usual interests and activities. Married x 9 years - 2 children 5 and 3. No family local. Spending time with family. Appetite adequate. Weight stable. Sleeps well most nights. Averages 8 hours. Focus and concentration stable - taking Adderall. Completing tasks. Managing aspects of household. Works full time - remote. Denies SI or HI.  Denies AH or VH. Denies self harm. Denies substance use.   Previous medication trials:  Buspar, Lexapro, Prozac, Adderall   Review of Systems:  Review of Systems  Musculoskeletal:  Negative for gait problem.  Neurological:  Negative for tremors.  Psychiatric/Behavioral:         Please refer to HPI    Medications: I have reviewed the patient's current medications.  Current Outpatient Medications  Medication Sig Dispense Refill   acetaminophen (TYLENOL) 500 MG tablet Take 1,000 mg by mouth every 6 (six) hours as needed for mild pain or headache.     famotidine (PEPCID) 20 MG tablet Take 20 mg by mouth 2 (two) times daily as needed for heartburn.      Prenatal Vit-Fe Fumarate-FA (PRENATAL MULTIVITAMIN) TABS tablet Take 1 tablet by mouth daily at 12 noon.      sertraline (ZOLOFT) 100 MG tablet  Take 1 tablet (100 mg total) by mouth daily. 30 tablet 2   No current facility-administered medications for this visit.    Medication Side Effects: None  Allergies: No Known Allergies  Past Medical History:  Diagnosis Date   Vaginal Pap smear, abnormal     Past Medical History, Surgical history, Social history, and Family history were reviewed and updated as appropriate.   Please see review of systems for further details on the patient's review from today.   Objective:   Physical Exam:  There were no vitals taken for this visit.  Physical Exam Constitutional:      General: She is not in acute distress. Musculoskeletal:        General: No deformity.  Neurological:     Mental Status: She is alert and oriented to person, place, and time.     Coordination: Coordination normal.  Psychiatric:        Attention and Perception: Attention and perception normal. She does not perceive auditory or visual hallucinations.        Mood and Affect: Mood normal. Mood is not anxious or depressed. Affect is not labile, blunt, angry or inappropriate.        Speech: Speech normal.        Behavior: Behavior normal.        Thought Content: Thought content normal. Thought content is not paranoid or delusional. Thought content does not include homicidal or suicidal ideation. Thought content does not include homicidal or suicidal plan.        Cognition and Memory: Cognition  and memory normal.        Judgment: Judgment normal.     Comments: Insight intact     Lab Review:     Component Value Date/Time   NA 136 11/13/2019 0858   K 3.6 11/13/2019 0858   CL 106 11/13/2019 0858   CO2 19 (L) 11/13/2019 0858   GLUCOSE 103 (H) 11/13/2019 0858   BUN 9 11/13/2019 0858   CREATININE 0.67 11/13/2019 0858   CALCIUM 8.6 (L) 11/13/2019 0858   PROT 5.5 (L) 11/13/2019 0858   ALBUMIN 3.0 (L) 11/13/2019 0858   AST 21 11/13/2019 0858   ALT 21 11/13/2019 0858   ALKPHOS 111 11/13/2019 0858   BILITOT 1.1  11/13/2019 0858   GFRNONAA >60 11/13/2019 0858   GFRAA >60 11/13/2019 0858       Component Value Date/Time   WBC 11.2 (H) 11/15/2019 0713   RBC 2.33 (L) 11/15/2019 0713   HGB 7.5 (L) 11/15/2019 0713   HCT 21.7 (L) 11/15/2019 0713   PLT 138 (L) 11/15/2019 0713   MCV 93.1 11/15/2019 0713   MCH 32.2 11/15/2019 0713   MCHC 34.6 11/15/2019 0713   RDW 12.9 11/15/2019 0713   LYMPHSABS 2.3 08/14/2017 0526   MONOABS 0.4 08/14/2017 0526   EOSABS 0.2 08/14/2017 0526   BASOSABS 0.0 08/14/2017 0526    No results found for: "POCLITH", "LITHIUM"   No results found for: "PHENYTOIN", "PHENOBARB", "VALPROATE", "CBMZ"   .res Assessment: Plan:    Plan:  PDMP reviewed  Continue Adderall XR 30mg  BID  Zoloft 50mg  to 100mg  daily - PMDD  Consider low dose of Abilify for PMDD  RTC 6 months  Patient advised to contact office with any questions, adverse effects, or acute worsening in signs and symptoms.  There are no diagnoses linked to this encounter.   Please see After Visit Summary for patient specific instructions.  Future Appointments  Date Time Provider Department Center  05/24/2023  2:40 PM Delayza Lungren, Thereasa Solo, NP [facility] None    No orders of the defined types were placed in this encounter.   -------------------------------

## 2023-11-15 ENCOUNTER — Ambulatory Visit (INDEPENDENT_AMBULATORY_CARE_PROVIDER_SITE_OTHER): Payer: 59 | Admitting: Adult Health

## 2023-11-15 ENCOUNTER — Encounter: Payer: Self-pay | Admitting: Adult Health

## 2023-11-15 DIAGNOSIS — F909 Attention-deficit hyperactivity disorder, unspecified type: Secondary | ICD-10-CM | POA: Diagnosis not present

## 2023-11-15 DIAGNOSIS — F411 Generalized anxiety disorder: Secondary | ICD-10-CM | POA: Diagnosis not present

## 2023-11-15 DIAGNOSIS — F331 Major depressive disorder, recurrent, moderate: Secondary | ICD-10-CM | POA: Diagnosis not present

## 2023-11-15 NOTE — Progress Notes (Signed)
 [name] [id number] 01-05-1986 38 y.o.  Subjective:   Patient ID:  Vicki Gill is a 38 y.o. (DOB 1986-03-11) female.  Chief Complaint: No chief complaint on file.   HPI Gwenette Brubacher presents to the office today for follow-up of ADD, MDD and GAD.  Describes mood today as ok. Pleasant. Denies tearfulness. Mood symptoms - denies depression. Reports anxiety some of the time - meetings at work - budgeting and money - husband. Reports irritability - more when PMS. Denies panic attacks. Reports some worry. Denies rumination and over thinking. Denies obsessive thoughts and acts. Reports mood is stable. Stating I feel like I'm doing pretty good overall. Feels like current medication regimen is helpful, but would like to consider other options. Stable interest and motivation. Taking medications as prescribed.  Energy levels stable. Active, has a regular exercise routine - 4 days a week. Enjoys some usual interests and activities. Married. Lives with husband - 2 children and dog. Appetite adequate. Weight gain. Sleeps well most nights. Averages 8 hours. Reports focus and concentration issues - constant distractions. Completing tasks. Managing aspects of household. 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  Denies AH or VH. Denies self harm. Denies substance use.   Previous medication trials:  Buspar, Lexapro, Prozac, Adderall  Review of Systems:  Review of Systems  Musculoskeletal:  Negative for gait problem.  Neurological:  Negative for tremors.  Psychiatric/Behavioral:         Please refer to HPI    Medications: I have reviewed the patient's current medications.  Current Outpatient Medications  Medication Sig Dispense Refill   acetaminophen  (TYLENOL ) 500 MG tablet Take 1,000 mg by mouth every 6 (six) hours as needed for mild pain or headache.     famotidine  (PEPCID ) 20 MG tablet Take 20 mg by mouth 2 (two) times daily as needed for heartburn.      Prenatal Vit-Fe Fumarate-FA  (PRENATAL MULTIVITAMIN) TABS tablet Take 1 tablet by mouth daily at 12 noon.      sertraline  (ZOLOFT ) 100 MG tablet Take 1 tablet (100 mg total) by mouth 2 (two) times daily. 180 tablet 3   No current facility-administered medications for this visit.    Medication Side Effects: None  Allergies: No Known Allergies  Past Medical History:  Diagnosis Date   Vaginal Pap smear, abnormal     Past Medical History, Surgical history, Social history, and Family history were reviewed and updated as appropriate.   Please see review of systems for further details on the patient's review from today.   Objective:   Physical Exam:  There were no vitals taken for this visit.  Physical Exam Constitutional:      General: She is not in acute distress. Musculoskeletal:        General: No deformity.  Neurological:     Mental Status: She is alert and oriented to person, place, and time.     Coordination: Coordination normal.  Psychiatric:        Attention and Perception: Attention and perception normal. She does not perceive auditory or visual hallucinations.        Mood and Affect: Affect is not labile, blunt, angry or inappropriate.        Speech: Speech normal.        Behavior: Behavior normal.        Thought Content: Thought content normal. Thought content is not paranoid or delusional. Thought content does not include homicidal or suicidal ideation. Thought content does not include homicidal  or suicidal plan.        Cognition and Memory: Cognition and memory normal.        Judgment: Judgment normal.     Comments: Insight intact     Lab Review:     Component Value Date/Time   NA 136 11/13/2019 0858   K 3.6 11/13/2019 0858   CL 106 11/13/2019 0858   CO2 19 (L) 11/13/2019 0858   GLUCOSE 103 (H) 11/13/2019 0858   BUN 9 11/13/2019 0858   CREATININE 0.67 11/13/2019 0858   CALCIUM 8.6 (L) 11/13/2019 0858   PROT 5.5 (L) 11/13/2019 0858   ALBUMIN 3.0 (L) 11/13/2019 0858   AST 21  11/13/2019 0858   ALT 21 11/13/2019 0858   ALKPHOS 111 11/13/2019 0858   BILITOT 1.1 11/13/2019 0858   GFRNONAA >60 11/13/2019 0858   GFRAA >60 11/13/2019 0858       Component Value Date/Time   WBC 11.2 (H) 11/15/2019 0713   RBC 2.33 (L) 11/15/2019 0713   HGB 7.5 (L) 11/15/2019 0713   HCT 21.7 (L) 11/15/2019 0713   PLT 138 (L) 11/15/2019 0713   MCV 93.1 11/15/2019 0713   MCH 32.2 11/15/2019 0713   MCHC 34.6 11/15/2019 0713   RDW 12.9 11/15/2019 0713   LYMPHSABS 2.3 08/14/2017 0526   MONOABS 0.4 08/14/2017 0526   EOSABS 0.2 08/14/2017 0526   BASOSABS 0.0 08/14/2017 0526    No results found for: POCLITH, LITHIUM   No results found for: PHENYTOIN, PHENOBARB, VALPROATE, CBMZ   .res Assessment: Plan:    Plan:  PDMP reviewed  Adderall XR 30mg  BID  Zoloft  100mg  daily - PMDD  Consider low dose of Abilify for PMDD  RTC 6 months  Patient advised to contact office with any questions, adverse effects, or acute worsening in signs and symptoms. There are no diagnoses linked to this encounter.   Please see After Visit Summary for patient specific instructions.  Future Appointments  Date Time Provider Department Center  11/15/2023  2:00 PM Disa Riedlinger Nattalie, NP [facility] None    No orders of the defined types were placed in this encounter.   -------------------------------

## 2023-11-24 ENCOUNTER — Ambulatory Visit: Payer: BC Managed Care – PPO | Admitting: Adult Health

## 2023-12-13 ENCOUNTER — Encounter: Payer: Self-pay | Admitting: Adult Health

## 2023-12-13 ENCOUNTER — Telehealth (INDEPENDENT_AMBULATORY_CARE_PROVIDER_SITE_OTHER): Payer: 59 | Admitting: Adult Health

## 2023-12-13 DIAGNOSIS — F411 Generalized anxiety disorder: Secondary | ICD-10-CM | POA: Diagnosis not present

## 2023-12-13 DIAGNOSIS — F909 Attention-deficit hyperactivity disorder, unspecified type: Secondary | ICD-10-CM

## 2023-12-13 DIAGNOSIS — F331 Major depressive disorder, recurrent, moderate: Secondary | ICD-10-CM

## 2023-12-13 MED ORDER — DESVENLAFAXINE SUCCINATE ER 25 MG PO TB24
25.0000 mg | ORAL_TABLET | Freq: Every day | ORAL | 2 refills | Status: DC
Start: 2023-12-13 — End: 2024-03-05

## 2023-12-13 NOTE — Progress Notes (Signed)
Vicki Gill [id number] 1986/03/01 38 y.o.  Virtual Visit via Video Note  I connected with pt @ on 12/13/23 at  2:30 PM EST by a video enabled telemedicine application and verified that I am speaking with the correct person using two identifiers.   I discussed the limitations of evaluation and management by telemedicine and the availability of in person appointments. The patient expressed understanding and agreed to proceed.  I discussed the assessment and treatment plan with the patient. The patient was provided an opportunity to ask questions and all were answered. The patient agreed with the plan and demonstrated an understanding of the instructions.   The patient was advised to call back or seek an in-person evaluation if the symptoms worsen or if the condition fails to improve as anticipated.  I provided 15 minutes of non-face-to-face time during this encounter.  The patient was located at home.  The provider was located at [facility].   Dorothyann Gibbs, NP   Subjective:   Patient ID:  Vicki Gill is a 38 y.o. (DOB Mar 02, 1986) female.  Chief Complaint: No chief complaint on file.   HPI Vicki Gill presents for follow-up of ADD, MDD and GAD.  Describes mood today as "ok". Pleasant. Denies tearfulness. Mood symptoms - denies depression, anxiety and irritability. Reports stable interest and motivation.Denies panic attacks. Denies worry, rumination and over thinking. Denies obsessive thoughts and acts. Reports mood is consistent. Stating "I feel like I'm doing ok". Feels like current medication regimen is helpful. Taking medications as prescribed.  Energy levels stable. Active, has a regular exercise routine - 4 days a week. Enjoys some usual interests and activities. Married. Lives with husband - 2 children and dog. Appetite adequate. Weight gain. Sleeps well most nights. Averages 8 hours. Reports focus and concentration stable. Completing tasks. Managing aspects of  household. 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  Denies AH or VH. Denies self harm. Denies substance use.   Previous medication trials:  Buspar, Lexapro, Prozac, Adderall    Review of Systems:  Review of Systems  Musculoskeletal:  Negative for gait problem.  Neurological:  Negative for tremors.  Psychiatric/Behavioral:         Please refer to HPI    Medications: I have reviewed the patient's current medications.  Current Outpatient Medications  Medication Sig Dispense Refill   acetaminophen (TYLENOL) 500 MG tablet Take 1,000 mg by mouth every 6 (six) hours as needed for mild pain or headache.     famotidine (PEPCID) 20 MG tablet Take 20 mg by mouth 2 (two) times daily as needed for heartburn.      Prenatal Vit-Fe Fumarate-FA (PRENATAL MULTIVITAMIN) TABS tablet Take 1 tablet by mouth daily at 12 noon.      sertraline (ZOLOFT) 100 MG tablet Take 1 tablet (100 mg total) by mouth 2 (two) times daily. 180 tablet 3   No current facility-administered medications for this visit.    Medication Side Effects: None  Allergies: No Known Allergies  Past Medical History:  Diagnosis Date   Vaginal Pap smear, abnormal     No family history on file.  Social History   Socioeconomic History   Marital status: Married    Spouse name: Not on file   Number of children: Not on file   Years of education: Not on file   Highest education level: Not on file  Occupational History   Not on file  Tobacco Use   Smoking status: Never   Smokeless tobacco:  Never  Vaping Use   Vaping status: Never Used  Substance and Sexual Activity   Alcohol use: Yes    Comment: none with pregnancy   Drug use: No   Sexual activity: Not on file  Other Topics Concern   Not on file  Social History Narrative   ** Merged History Encounter **       Social Drivers of Health   Financial Resource Strain: Medium Risk (08/15/2023)   Received from [facility]   Overall Financial Resource Strain  (CARDIA)    Difficulty of Paying Living Expenses: Somewhat hard  Food Insecurity: No Food Insecurity (08/15/2023)   Received from [facility]   Hunger Vital Sign    Worried About Running Out of Food in the Last Year: Never true    Ran Out of Food in the Last Year: Never true  Transportation Needs: No Transportation Needs (08/15/2023)   Received from [facility] - Transportation    Lack of Transportation (Medical): No    Lack of Transportation (Non-Medical): No  Physical Activity: Insufficiently Active (08/15/2023)   Received from [facility]   Exercise Vital Sign    Days of Exercise per Week: 3 days    Minutes of Exercise per Session: 40 min  Stress: Stress Concern Present (08/15/2023)   Received from [facility] - Occupational Stress Questionnaire    Feeling of Stress : Rather much  Social Connections: Moderately Integrated (08/15/2023)   Received from [facility]   Social Network    How would you rate your social network (family, work, friends)?: Adequate participation with social networks  Intimate Partner Violence: Not At Risk (08/15/2023)   Received from [facility]   HITS    Over the last 12 months how often did your partner physically hurt you?: Never    Over the last 12 months how often did your partner insult you or talk down to you?: Never    Over the last 12 months how often did your partner threaten you with physical harm?: Never    Over the last 12 months how often did your partner scream or curse at you?: Rarely    Past Medical History, Surgical history, Social history, and Family history were reviewed and updated as appropriate.   Please see review of systems for further details on the patient's review from today.   Objective:   Physical Exam:  There were no vitals taken for this visit.  Physical Exam Constitutional:      General: She is not in acute distress. Musculoskeletal:         General: No deformity.  Neurological:     Mental Status: She is alert and oriented to person, place, and time.     Coordination: Coordination normal.  Psychiatric:        Attention and Perception: Attention and perception normal. She does not perceive auditory or visual hallucinations.        Mood and Affect: Affect is not labile, blunt, angry or inappropriate.        Speech: Speech normal.        Behavior: Behavior normal.        Thought Content: Thought content normal. Thought content is not paranoid or delusional. Thought content does not include homicidal or suicidal ideation. Thought content does not include homicidal or suicidal plan.        Cognition and Memory: Cognition and memory normal.  Judgment: Judgment normal.     Comments: Insight intact     Lab Review:     Component Value Date/Time   NA 136 11/13/2019 0858   K 3.6 11/13/2019 0858   CL 106 11/13/2019 0858   CO2 19 (L) 11/13/2019 0858   GLUCOSE 103 (H) 11/13/2019 0858   BUN 9 11/13/2019 0858   CREATININE 0.67 11/13/2019 0858   CALCIUM 8.6 (L) 11/13/2019 0858   PROT 5.5 (L) 11/13/2019 0858   ALBUMIN 3.0 (L) 11/13/2019 0858   AST 21 11/13/2019 0858   ALT 21 11/13/2019 0858   ALKPHOS 111 11/13/2019 0858   BILITOT 1.1 11/13/2019 0858   GFRNONAA >60 11/13/2019 0858   GFRAA >60 11/13/2019 0858       Component Value Date/Time   WBC 11.2 (H) 11/15/2019 0713   RBC 2.33 (L) 11/15/2019 0713   HGB 7.5 (L) 11/15/2019 0713   HCT 21.7 (L) 11/15/2019 0713   PLT 138 (L) 11/15/2019 0713   MCV 93.1 11/15/2019 0713   MCH 32.2 11/15/2019 0713   MCHC 34.6 11/15/2019 0713   RDW 12.9 11/15/2019 0713   LYMPHSABS 2.3 08/14/2017 0526   MONOABS 0.4 08/14/2017 0526   EOSABS 0.2 08/14/2017 0526   BASOSABS 0.0 08/14/2017 0526    No results found for: "POCLITH", "LITHIUM"   No results found for: "PHENYTOIN", "PHENOBARB", "VALPROATE", "CBMZ"   .res Assessment: Plan:    Plan:  PDMP reviewed  Adderall XR 30mg   BID Zoloft 100mg  daily - PMDD  Add Pristiq 25mg  daily  RTC 2 months  20 minutes spent dedicated to the care of this patient on the date of this encounter to include pre-visit review of records, ordering of medication, post visit documentation, and face-to-face time with the patient discussing depression, anxiety and ADD. Discussed adding Pristiq 25mg  daily to the current medication regimen.  Patient advised to contact office with any questions, adverse effects, or acute worsening in signs and symptoms. There are no diagnoses linked to this encounter.   Please see After Visit Summary for patient specific instructions.  Future Appointments  Date Time Provider Department Center  12/13/2023  2:30 PM Noland Pizano, Thereasa Solo, NP [facility] None    No orders of the defined types were placed in this encounter.     -------------------------------

## 2023-12-15 ENCOUNTER — Encounter: Payer: Self-pay | Admitting: Adult Health

## 2024-03-04 ENCOUNTER — Other Ambulatory Visit: Payer: Self-pay | Admitting: Adult Health

## 2024-03-04 DIAGNOSIS — F411 Generalized anxiety disorder: Secondary | ICD-10-CM

## 2024-03-04 DIAGNOSIS — F331 Major depressive disorder, recurrent, moderate: Secondary | ICD-10-CM

## 2024-04-20 ENCOUNTER — Other Ambulatory Visit: Payer: Self-pay | Admitting: Adult Health

## 2024-04-20 DIAGNOSIS — F411 Generalized anxiety disorder: Secondary | ICD-10-CM

## 2024-04-20 DIAGNOSIS — F331 Major depressive disorder, recurrent, moderate: Secondary | ICD-10-CM

## 2024-06-07 ENCOUNTER — Other Ambulatory Visit: Payer: Self-pay | Admitting: Adult Health

## 2024-06-07 DIAGNOSIS — F411 Generalized anxiety disorder: Secondary | ICD-10-CM

## 2024-06-07 DIAGNOSIS — F331 Major depressive disorder, recurrent, moderate: Secondary | ICD-10-CM
